# Patient Record
Sex: Female | Born: 2000 | Race: White | Hispanic: No | Marital: Single | Smoking: Never smoker
Health system: Southern US, Community
[De-identification: ages and names within clinical notes are randomized; demographics above are authoritative.]

## PROBLEM LIST (undated history)

## (undated) DIAGNOSIS — F419 Anxiety disorder, unspecified: Secondary | ICD-10-CM

---
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
## 2020-04-01 ENCOUNTER — Other Ambulatory Visit: Payer: Self-pay | Admitting: Certified Nurse Midwife

## 2020-04-01 NOTE — Progress Notes (Signed)
Patient with iron deficiency anemia in third trimester of pregnancy. Labs last drawn on 03/31/2020 at [redacted]w[redacted]d. Hemoglobin was 10.5 and ferritin was 7. Due to this, IV iron repletement is recommended. Will administer 510mg  ferumoxytol weekly x 2 doses. Orders placed.  , CNM 04/01/2020 12:05 PM

## 2020-04-02 ENCOUNTER — Ambulatory Visit: Payer: Medicaid Other | Attending: Certified Nurse Midwife

## 2020-04-09 ENCOUNTER — Ambulatory Visit
Admission: RE | Admit: 2020-04-09 | Discharge: 2020-04-09 | Disposition: A | Discharge status: Home / Self Care | Payer: Medicaid Other | Source: Ambulatory Visit | Attending: Certified Nurse Midwife | Admitting: Certified Nurse Midwife

## 2020-04-09 DIAGNOSIS — D509 Iron deficiency anemia, unspecified: Secondary | ICD-10-CM | POA: Diagnosis not present

## 2020-04-09 DIAGNOSIS — O99013 Anemia complicating pregnancy, third trimester: Secondary | ICD-10-CM | POA: Insufficient documentation

## 2020-04-09 DIAGNOSIS — Z3A36 36 weeks gestation of pregnancy: Secondary | ICD-10-CM | POA: Insufficient documentation

## 2020-04-09 LAB — OB RESULTS CONSOLE GBS: GBS: NEGATIVE

## 2020-04-09 LAB — OB RESULTS CONSOLE HIV ANTIBODY (ROUTINE TESTING): HIV: NONREACTIVE

## 2020-04-09 LAB — OB RESULTS CONSOLE RPR: RPR: NONREACTIVE

## 2020-04-09 MED ORDER — SODIUM CHLORIDE 0.9 % IV SOLN
510.0000 mg | INTRAVENOUS | Status: DC
  Administered 2020-04-09: 510 mg via INTRAVENOUS
  Filled 2020-04-09: qty 17

## 2020-04-16 ENCOUNTER — Ambulatory Visit
Admission: RE | Admit: 2020-04-16 | Discharge: 2020-04-16 | Disposition: A | Discharge status: Home / Self Care | Payer: Medicaid Other | Source: Ambulatory Visit | Attending: Certified Nurse Midwife | Admitting: Certified Nurse Midwife

## 2020-04-16 ENCOUNTER — Other Ambulatory Visit: Payer: Self-pay

## 2020-04-16 ENCOUNTER — Observation Stay
Admission: EM | Admit: 2020-04-16 | Discharge: 2020-04-16 | Disposition: A | Discharge status: Home / Self Care | Payer: Medicaid Other

## 2020-04-16 DIAGNOSIS — Z3A38 38 weeks gestation of pregnancy: Secondary | ICD-10-CM | POA: Insufficient documentation

## 2020-04-16 DIAGNOSIS — O99013 Anemia complicating pregnancy, third trimester: Secondary | ICD-10-CM | POA: Insufficient documentation

## 2020-04-16 DIAGNOSIS — O134 Gestational [pregnancy-induced] hypertension without significant proteinuria, complicating childbirth: Secondary | ICD-10-CM | POA: Diagnosis not present

## 2020-04-16 DIAGNOSIS — Z3403 Encounter for supervision of normal first pregnancy, third trimester: Secondary | ICD-10-CM

## 2020-04-16 DIAGNOSIS — Z20822 Contact with and (suspected) exposure to covid-19: Secondary | ICD-10-CM | POA: Insufficient documentation

## 2020-04-16 DIAGNOSIS — O26893 Other specified pregnancy related conditions, third trimester: Secondary | ICD-10-CM | POA: Diagnosis present

## 2020-04-16 DIAGNOSIS — O163 Unspecified maternal hypertension, third trimester: Secondary | ICD-10-CM | POA: Diagnosis present

## 2020-04-16 DIAGNOSIS — O26843 Uterine size-date discrepancy, third trimester: Secondary | ICD-10-CM | POA: Insufficient documentation

## 2020-04-16 HISTORY — DX: Anxiety disorder, unspecified: F41.9

## 2020-04-16 LAB — COMPREHENSIVE METABOLIC PANEL
ALT: 12 U/L (ref 0–44)
AST: 16 U/L (ref 15–41)
Albumin: 3 g/dL — ABNORMAL LOW (ref 3.5–5.0)
Alkaline Phosphatase: 109 U/L (ref 38–126)
Anion gap: 9 (ref 5–15)
BUN: 6 mg/dL (ref 6–20)
CO2: 24 mmol/L (ref 22–32)
Calcium: 9.1 mg/dL (ref 8.9–10.3)
Chloride: 103 mmol/L (ref 98–111)
Creatinine, Ser: 0.58 mg/dL (ref 0.44–1.00)
GFR, Estimated: >60 mL/min (ref >60)
Glucose, Bld: 86 mg/dL (ref 70–99)
Potassium: 3.6 mmol/L (ref 3.5–5.1)
Sodium: 136 mmol/L (ref 135–145)
Total Bilirubin: 0.5 mg/dL (ref 0.3–1.2)
Total Protein: 6.6 g/dL (ref 6.5–8.1)

## 2020-04-16 LAB — CBC
HCT: 32.2 % — ABNORMAL LOW (ref 36.0–46.0)
Hemoglobin: 10.6 g/dL — ABNORMAL LOW (ref 12.0–15.0)
MCH: 27.7 pg (ref 26.0–34.0)
MCHC: 32.9 g/dL (ref 30.0–36.0)
MCV: 84.1 fL (ref 80.0–100.0)
Platelets: 247 10*3/uL (ref 150–400)
RBC: 3.83 MIL/uL — ABNORMAL LOW (ref 3.87–5.11)
RDW: 14.3 % (ref 11.5–15.5)
WBC: 14.7 10*3/uL — ABNORMAL HIGH (ref 4.0–10.5)
nRBC: 0 % (ref 0.0–0.2)

## 2020-04-16 LAB — RESPIRATORY PANEL BY RT PCR (FLU A&B, COVID)
Influenza A by PCR: NEGATIVE
Influenza B by PCR: NEGATIVE
SARS Coronavirus 2 by RT PCR: NEGATIVE

## 2020-04-16 LAB — PROTEIN / CREATININE RATIO, URINE
Creatinine, Urine: 80 mg/dL
Protein Creatinine Ratio: 0.18 mg/mg{Cre} — ABNORMAL HIGH (ref 0.00–0.15)
Total Protein, Urine: 14 mg/dL

## 2020-04-16 LAB — SAMPLE TO BLOOD BANK

## 2020-04-16 MED ORDER — ACETAMINOPHEN 500 MG PO TABS: 1000.0000 mg | ORAL_TABLET | Freq: Four times a day (QID) | ORAL | Status: DC | PRN

## 2020-04-16 MED ORDER — CALCIUM CARBONATE ANTACID 500 MG PO CHEW: 2.0000 | CHEWABLE_TABLET | ORAL | Status: DC | PRN

## 2020-04-16 MED ORDER — SODIUM CHLORIDE 0.9 % IV SOLN
510.0000 mg | Freq: Once | INTRAVENOUS | Status: DC
  Filled 2020-04-16: qty 17

## 2020-04-16 MED ORDER — PRENATAL MULTIVITAMIN CH: 1.0000 | ORAL_TABLET | Freq: Every day | ORAL | Status: DC

## 2020-04-16 NOTE — OB Triage Note (Signed)
Pt presents to L&D after BP elevated in office for [facility] workup. Pt reports headache that comes and goes since iron infusion last week. Pt reports good fetal movement and denies contractions, leaking fluid or vaginal bleeding. EFM and toco applied and explained as well as POC

## 2020-04-17 ENCOUNTER — Other Ambulatory Visit: Payer: Self-pay

## 2020-04-17 ENCOUNTER — Encounter: Payer: Self-pay | Admitting: Obstetrics & Gynecology

## 2020-04-17 NOTE — Discharge Summary (Signed)
Tara Higgins is a 19 y.o. female. She is at [redacted]w[redacted]d gestation. Patient's last menstrual period was 07/05/2019 (exact date). Not c/w early [place] at [redacted]w[redacted]d Estimated Date of Delivery: 05/01/20  Prenatal care site: [facility]  Chief complaint: elevated b/p in office   Tara Higgins presents today for a preeclampsia evaluation d/t elevated b/p in office today.    S: Resting comfortably. no CTX, no VB.no LOF,  Active fetal movement. Denies HA, changes in vision, or RUQ pain  Pregnancy complicated by: 1. Obesity in pregnancy 2. History of anxiety with recent suicidal ideation  3. Difficult social situation  4. Brothers with congenital heart defect  5. Iron deficiency anemia in pregnancy  6. Uterine S>D   Maternal Medical History:  Past Medical Hx:  has a past medical history of Anxiety and Suicidal ideation (10/2019).    Past Surgical Hx: History reviewed. No pertinent surgical history.    No Known Allergies   Prior to Admission medications   Medication Sig Start Date End Date Taking? Authorizing Provider  Prenatal Multivit-Min-Fe-FA (PRENATAL, W/IRON & FA,) 27-0.8 MG TABS Take 1 tablet by mouth daily.    [provider]    Social History: She  reports that she has never smoked. She has never used smokeless tobacco. She reports previous alcohol use. She reports previous drug use.  Family History: family history includes Anxiety disorder in her mother; Depression in her mother; Drug abuse in her mother; Liver cancer in her maternal grandmother.   Review of Systems: A full review of systems was performed and negative except as noted in the HPI.    O:  BP (!) 143/81   Pulse (!) 113   Temp 98.5 F (36.9 C) (Oral)   Resp 18   Ht 5\' 4"  (1.626 m)   Wt 102.1 kg   LMP 07/05/2019 (Exact Date)   BMI 38.62 kg/m  Results for orders placed or performed during the hospital encounter of 04/16/20 (from the past 48 hour(s))  Protein / creatinine ratio, urine   Collection Time:  04/16/20  5:27 PM  Result Value Ref Range   Creatinine, Urine 80 mg/dL   Total Protein, Urine 14 mg/dL   Protein Creatinine Ratio 0.18 (H) 0.00 - 0.15 mg/mg[Cre]  CBC   Collection Time: 04/16/20  5:42 PM  Result Value Ref Range   WBC 14.7 (H) 4.0 - 10.5 K/uL   RBC 3.83 (L) 3.87 - 5.11 MIL/uL   Hemoglobin 10.6 (L) 12.0 - 15.0 g/dL   HCT 13/03/21 (L) 36 - 46 %   MCV 84.1 80.0 - 100.0 fL   MCH 27.7 26.0 - 34.0 pg   MCHC 32.9 30.0 - 36.0 g/dL   RDW 33.2 95.1 - 88.4 %   Platelets 247 150 - 400 K/uL   nRBC 0.0 0.0 - 0.2 %  Comprehensive metabolic panel   Collection Time: 04/16/20  5:42 PM  Result Value Ref Range   Sodium 136 135 - 145 mmol/L   Potassium 3.6 3.5 - 5.1 mmol/L   Chloride 103 98 - 111 mmol/L   CO2 24 22 - 32 mmol/L   Glucose, Bld 86 70 - 99 mg/dL   BUN 6 6 - 20 mg/dL   Creatinine, Ser 13/03/21 0.44 - 1.00 mg/dL   Calcium 9.1 8.9 - 0.63 mg/dL   Total Protein 6.6 6.5 - 8.1 g/dL   Albumin 3.0 (L) 3.5 - 5.0 g/dL   AST 16 15 - 41 U/L   ALT 12 0 - 44  U/L   Alkaline Phosphatase 109 38 - 126 U/L   Total Bilirubin 0.5 0.3 - 1.2 mg/dL   GFR, Estimated >63 >78 mL/min   Anion gap 9 5 - 15  Sample to Blood Bank   Collection Time: 04/16/20  5:42 PM  Result Value Ref Range   Blood Bank Specimen SAMPLE AVAILABLE FOR TESTING    Sample Expiration      04/19/2020,2359 Performed at [facility], [address] [place], [place] [address]   Respiratory Panel by RT PCR (Flu A&B, Covid) - Nasopharyngeal Swab   Collection Time: 04/16/20  7:33 PM   Specimen: Nasopharyngeal Swab  Result Value Ref Range   SARS Coronavirus 2 by RT PCR NEGATIVE NEGATIVE   Influenza A by PCR NEGATIVE NEGATIVE   Influenza B by PCR NEGATIVE NEGATIVE     Constitutional: NAD, AAOx3  HE/ENT: extraocular movements grossly intact, moist mucous membranes CV: RRR PULM: nl respiratory effort, CTABL     Abd: gravid, non-tender, non-distended, soft      Ext: Non-tender, Nonedmeatous   Psych: mood  appropriate, speech normal Pelvic : deferred  Fetal Monitor: Baseline: 150 bpm, Variability: moderate, Accelerations: present and Decelerations: Absent Toco: Irregular, mild contractions present Category: 1   Assessment: 19 y.o. [redacted]w[redacted]d here for antenatal surveillance during pregnancy.  Principle diagnosis: Gestational hypertension in 3rd trimester   Plan:   Fetal Wellbeing: Reassuring Cat 1 tracing.  Reactive NST   Gestational HTN - several mild range blood pressures are present today in the clinic and in triage   Preeclampsia labs normal  Denies symptoms of headache, changes in vision, or RUQ pain  Discussed induction of labor for gestational HTN.  Risks/benefits of induction of labor versus continued antepartum surveillance were discussed.   Tara Higgins desires to discharge home tonight and is amenable to induction of labor on Friday morning.    Induction of labor for gestational HTN scheduled in L&D for Friday, 04/18/2020 at 0800.    Warning signs reviewed.    D/c home stable, precautions reviewed, follow-up as scheduled on Friday morning.    ----- Margaretmary Eddy, CNM Certified Nurse Midwife [facility]
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
## 2020-04-18 ENCOUNTER — Other Ambulatory Visit: Payer: Self-pay

## 2020-04-18 ENCOUNTER — Encounter: Payer: Self-pay | Admitting: Obstetrics and Gynecology

## 2020-04-18 ENCOUNTER — Inpatient Hospital Stay
Admission: EM | Admit: 2020-04-18 | Discharge: 2020-04-18 | DRG: 833 | Disposition: A | Discharge status: Home / Self Care | Payer: Medicaid Other | Attending: Obstetrics and Gynecology | Admitting: Obstetrics and Gynecology

## 2020-04-18 DIAGNOSIS — O139 Gestational [pregnancy-induced] hypertension without significant proteinuria, unspecified trimester: Secondary | ICD-10-CM | POA: Diagnosis present

## 2020-04-18 DIAGNOSIS — E669 Obesity, unspecified: Secondary | ICD-10-CM | POA: Diagnosis present

## 2020-04-18 DIAGNOSIS — D649 Anemia, unspecified: Secondary | ICD-10-CM | POA: Diagnosis present

## 2020-04-18 DIAGNOSIS — O99013 Anemia complicating pregnancy, third trimester: Secondary | ICD-10-CM | POA: Diagnosis present

## 2020-04-18 DIAGNOSIS — O133 Gestational [pregnancy-induced] hypertension without significant proteinuria, third trimester: Secondary | ICD-10-CM | POA: Diagnosis present

## 2020-04-18 DIAGNOSIS — Z3A38 38 weeks gestation of pregnancy: Secondary | ICD-10-CM

## 2020-04-18 DIAGNOSIS — Z5329 Procedure and treatment not carried out because of patient's decision for other reasons: Secondary | ICD-10-CM | POA: Diagnosis present

## 2020-04-18 DIAGNOSIS — O99213 Obesity complicating pregnancy, third trimester: Secondary | ICD-10-CM | POA: Diagnosis present

## 2020-04-18 DIAGNOSIS — Z20822 Contact with and (suspected) exposure to covid-19: Secondary | ICD-10-CM | POA: Diagnosis present

## 2020-04-18 MED ORDER — MISOPROSTOL 25 MCG QUARTER TABLET: 25.0000 ug | ORAL_TABLET | ORAL | Status: DC | PRN

## 2020-04-18 MED ORDER — LACTATED RINGERS IV SOLN: 500.0000 mL | INTRAVENOUS | Status: DC | PRN

## 2020-04-18 MED ORDER — BUTORPHANOL TARTRATE 1 MG/ML IJ SOLN: 1.0000 mg | INTRAMUSCULAR | Status: DC | PRN

## 2020-04-18 MED ORDER — ACETAMINOPHEN 500 MG PO TABS: 1000.0000 mg | ORAL_TABLET | Freq: Four times a day (QID) | ORAL | Status: DC | PRN

## 2020-04-18 MED ORDER — LACTATED RINGERS IV SOLN: INTRAVENOUS | Status: DC

## 2020-04-18 MED ORDER — SODIUM CHLORIDE 0.9 % IV SOLN: 250.0000 mL | INTRAVENOUS | Status: DC | PRN

## 2020-04-18 MED ORDER — LIDOCAINE HCL (PF) 1 % IJ SOLN: 30.0000 mL | INTRAMUSCULAR | Status: DC | PRN

## 2020-04-18 MED ORDER — ONDANSETRON HCL 4 MG/2ML IJ SOLN: 4.0000 mg | Freq: Four times a day (QID) | INTRAMUSCULAR | Status: DC | PRN

## 2020-04-18 MED ORDER — CALCIUM CARBONATE ANTACID 500 MG PO CHEW: 400.0000 mg | CHEWABLE_TABLET | Freq: Three times a day (TID) | ORAL | Status: DC | PRN

## 2020-04-18 NOTE — Progress Notes (Signed)
Called to room by nursing, Pt refusing interventions for admission for planned IOL due to [facility].   Today's Vitals   04/18/20 0940  BP: (!) 147/92  Pulse: (!) 107  Resp: 18  Temp: 98.4 F (36.9 C)  TempSrc: Oral  Weight: 102.1 kg  Height: 5\' 4"  (1.626 m)   Body mass index is 38.62 kg/m.  Physical Exam Constitutional:      Appearance: Normal appearance.  HENT:     Head: Normocephalic and atraumatic.  Eyes:     General: No scleral icterus.    Extraocular Movements: Extraocular movements intact.  Cardiovascular:     Rate and Rhythm: Regular rhythm.  Pulmonary:     Effort: Pulmonary effort is normal. No respiratory distress.  Abdominal:     Comments: Gravid nontender fundus.   Musculoskeletal:     Right lower leg: Edema present.     Left lower leg: Edema present.     Comments: Bilateral edema of hands/forearms noted.    Neurological:     Mental Status: She is alert.     Discussed with pt regarding risks of leaving without IOL due to [facility]. Reviewed risks of seizures, abruption, fetal distress or fetal death. Pt and family member on phone (mother-in-law) state that pt is scared and prefers to be taken care of in [place]. Reviewed prenatal care at [facility] clinic with planned delivery at [facility]. Pt notified that she may leave against medical advice, and that her visit may not be covered by her insurance.   [facility], CNM 04/18/2020 10:30 AM

## 2020-04-18 NOTE — Progress Notes (Signed)
  Tara Higgins is a 19 y.o. female patient that presents to L&D for scheduled induction of labor for gestational hypertension. Pt is alert - oriented  X 4 - no acute distress noted.  Pt is a G1P0 at [redacted]w[redacted]d. Pt denies signs and symptons consistent with rupture of membranes or active vaginal bleeding. Pt denies contractions and states positive fetal movement.  Pt placed on continuous EFM and TOCO applied to non-tender abdomen and assessing. Initial FHT 150.  Elevated blood pressure, initial BP 147/92, Temp 98.4. Pt has generalized swelling +2.  Orders released for induction. R.McVey, CNM aware of pt.
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
## 2020-04-18 NOTE — Progress Notes (Signed)
PT refused PIV start, provider and RN at bedside.

## 2020-04-18 NOTE — Progress Notes (Signed)
Pt refused IV. There were 2 attempts at IV placement by RN, both IVs became infiltrated. Pt refused further IV placement and wants to leave the facility. R.McVey, CNM aware and spoke with pt. Pt spoke with significant other who is at bedside and to her family member over the phone and decided to stay and proceed with her induction. Pt agreed to having an IV placed and blood work drawn by IV team. IV team consult order placed, admission questions completed by this RN. Marcy Siren, RN from IV team came to bedside at 1055 for IV placement. Pt refused IV by IV team and stated she wanted to go home or [facility]. Heloise Ochoa, CNM at bedside and discussed with patient the importance of staying and proceeding with the induction. Pt reused. Pt signed against medical advice form, witnessed by Patsy Lager dated 04/18/2020 @1100 .  Pt ambulatory and escorted out of the hospital by security at 1105.

## 2020-12-06 ENCOUNTER — Other Ambulatory Visit: Payer: Self-pay

## 2020-12-06 ENCOUNTER — Encounter: Payer: Self-pay | Admitting: Emergency Medicine

## 2020-12-06 DIAGNOSIS — Z5321 Procedure and treatment not carried out due to patient leaving prior to being seen by health care provider: Secondary | ICD-10-CM | POA: Insufficient documentation

## 2020-12-06 DIAGNOSIS — M545 Low back pain, unspecified: Secondary | ICD-10-CM | POA: Insufficient documentation

## 2020-12-06 NOTE — ED Triage Notes (Signed)
Pt reports that she developed lower back pain this am, it is now gong into her hips. It only hurts when she sits or bends over. She thinks she pulled a muscle. Denies any blood in urine or pain during urination

## 2020-12-07 ENCOUNTER — Emergency Department
Admission: EM | Admit: 2020-12-07 | Discharge: 2020-12-07 | Disposition: A | Discharge status: Home / Self Care | Payer: Medicaid Other | Attending: Emergency Medicine | Admitting: Emergency Medicine

## 2020-12-07 NOTE — ED Notes (Signed)
No answer when called 

## 2020-12-07 NOTE — ED Notes (Signed)
No answer when called in lobby, ed tech shan,  looked outside for pt, pt not outside.

## 2020-12-07 NOTE — ED Notes (Signed)
No answer when called x 3.  

## 2021-06-14 NOTE — L&D Delivery Note (Signed)
Delivery Note  First Stage: Labor onset: 0900 Augmentation : cytotec x2 Analgesia /Anesthesia intrapartum: IV fentanyl SROM at 0944  Second Stage: Complete dilation at 0944 Onset of pushing at 0944 FHR second stage: not tracing d/t rapidness of imminent delivery, Cat I tracing immediately prior to delivery  Delivery of a viable female infant 02/13/2022 at 0946 by Donato Schultz, CNM. delivery of fetal head in OA position with restitution to LOA then back to OA. No nuchal cord;  Anterior then posterior shoulders delivered easily with gentle downward traction. Baby placed on mom's chest, and attended to by peds.  Cord double clamped after cessation of pulsation, cut by FOB Cord blood sample collected   Third Stage: Placenta delivered Tomasa Blase intact with 3 VC @ [phone] Placenta disposition: discarded Uterine tone firm / bleeding scant  No laceration identified  Anesthesia for repair: none needed Repair none Est. Blood Loss (mL):  Complications: none, precipitous delivery  Mom to postpartum.  Baby to Couplet care / Skin to Skin.  Newborn: Birth Weight: TBD, infant skin-to-skin  Apgar Scores: 8, 9 Feeding planned: breastmilk and formula

## 2021-08-07 DIAGNOSIS — O0993 Supervision of high risk pregnancy, unspecified, third trimester: Secondary | ICD-10-CM | POA: Insufficient documentation

## 2021-08-30 DIAGNOSIS — O99211 Obesity complicating pregnancy, first trimester: Secondary | ICD-10-CM | POA: Insufficient documentation

## 2021-09-29 LAB — OB RESULTS CONSOLE GC/CHLAMYDIA
Chlamydia: NEGATIVE
Neisseria Gonorrhea: NEGATIVE

## 2021-09-29 LAB — OB RESULTS CONSOLE HIV ANTIBODY (ROUTINE TESTING): HIV: NONREACTIVE

## 2021-09-29 LAB — OB RESULTS CONSOLE HEPATITIS B SURFACE ANTIGEN: Hepatitis B Surface Ag: NEGATIVE

## 2021-09-29 LAB — OB RESULTS CONSOLE VARICELLA ZOSTER ANTIBODY, IGG: Varicella: IMMUNE

## 2021-09-29 LAB — OB RESULTS CONSOLE GBS: GBS: POSITIVE

## 2021-09-29 LAB — OB RESULTS CONSOLE RUBELLA ANTIBODY, IGM: Rubella: IMMUNE

## 2021-09-29 LAB — OB RESULTS CONSOLE RPR: RPR: NONREACTIVE

## 2021-10-06 DIAGNOSIS — R8271 Bacteriuria: Secondary | ICD-10-CM | POA: Insufficient documentation

## 2021-12-14 DIAGNOSIS — Z23 Encounter for immunization: Secondary | ICD-10-CM | POA: Diagnosis not present

## 2021-12-24 DIAGNOSIS — Z3483 Encounter for supervision of other normal pregnancy, third trimester: Secondary | ICD-10-CM | POA: Diagnosis not present

## 2021-12-29 DIAGNOSIS — O26843 Uterine size-date discrepancy, third trimester: Secondary | ICD-10-CM | POA: Diagnosis not present

## 2022-01-11 DIAGNOSIS — O0993 Supervision of high risk pregnancy, unspecified, third trimester: Secondary | ICD-10-CM | POA: Diagnosis not present

## 2022-01-11 DIAGNOSIS — O163 Unspecified maternal hypertension, third trimester: Secondary | ICD-10-CM | POA: Diagnosis not present

## 2022-01-25 DIAGNOSIS — R7302 Impaired glucose tolerance (oral): Secondary | ICD-10-CM | POA: Diagnosis not present

## 2022-01-25 DIAGNOSIS — O133 Gestational [pregnancy-induced] hypertension without significant proteinuria, third trimester: Secondary | ICD-10-CM | POA: Diagnosis not present

## 2022-01-26 ENCOUNTER — Encounter: Payer: Self-pay | Admitting: Obstetrics and Gynecology

## 2022-01-26 ENCOUNTER — Other Ambulatory Visit: Payer: Self-pay

## 2022-01-26 ENCOUNTER — Observation Stay
Admission: EM | Admit: 2022-01-26 | Discharge: 2022-01-26 | Disposition: A | Discharge status: Home / Self Care | Payer: Medicaid Other | Attending: Obstetrics and Gynecology | Admitting: Obstetrics and Gynecology

## 2022-01-26 DIAGNOSIS — O99213 Obesity complicating pregnancy, third trimester: Secondary | ICD-10-CM | POA: Insufficient documentation

## 2022-01-26 DIAGNOSIS — Z3A34 34 weeks gestation of pregnancy: Secondary | ICD-10-CM | POA: Diagnosis not present

## 2022-01-26 DIAGNOSIS — E669 Obesity, unspecified: Secondary | ICD-10-CM | POA: Insufficient documentation

## 2022-01-26 DIAGNOSIS — O133 Gestational [pregnancy-induced] hypertension without significant proteinuria, third trimester: Secondary | ICD-10-CM | POA: Diagnosis not present

## 2022-01-26 DIAGNOSIS — Z79899 Other long term (current) drug therapy: Secondary | ICD-10-CM | POA: Insufficient documentation

## 2022-01-26 LAB — COMPREHENSIVE METABOLIC PANEL
ALT: 10 U/L (ref 0–44)
AST: 18 U/L (ref 15–41)
Albumin: 2.9 g/dL — ABNORMAL LOW (ref 3.5–5.0)
Alkaline Phosphatase: 101 U/L (ref 38–126)
Anion gap: 7 (ref 5–15)
BUN: 7 mg/dL (ref 6–20)
CO2: 22 mmol/L (ref 22–32)
Calcium: 9.6 mg/dL (ref 8.9–10.3)
Chloride: 105 mmol/L (ref 98–111)
Creatinine, Ser: 0.57 mg/dL (ref 0.44–1.00)
GFR, Estimated: >60 mL/min (ref >60)
Glucose, Bld: 90 mg/dL (ref 70–99)
Potassium: 4 mmol/L (ref 3.5–5.1)
Sodium: 134 mmol/L — ABNORMAL LOW (ref 135–145)
Total Bilirubin: 0.3 mg/dL (ref 0.3–1.2)
Total Protein: 6.7 g/dL (ref 6.5–8.1)

## 2022-01-26 LAB — PROTEIN / CREATININE RATIO, URINE
Creatinine, Urine: 53 mg/dL
Protein Creatinine Ratio: 0.17 mg/mg{Cre} — ABNORMAL HIGH (ref 0.00–0.15)
Total Protein, Urine: 9 mg/dL

## 2022-01-26 LAB — CBC
HCT: 34.7 % — ABNORMAL LOW (ref 36.0–46.0)
Hemoglobin: 11.8 g/dL — ABNORMAL LOW (ref 12.0–15.0)
MCH: 28.4 pg (ref 26.0–34.0)
MCHC: 34 g/dL (ref 30.0–36.0)
MCV: 83.6 fL (ref 80.0–100.0)
Platelets: 184 10*3/uL (ref 150–400)
RBC: 4.15 MIL/uL (ref 3.87–5.11)
RDW: 12.4 % (ref 11.5–15.5)
WBC: 10.1 10*3/uL (ref 4.0–10.5)
nRBC: 0 % (ref 0.0–0.2)

## 2022-01-26 NOTE — OB Triage Note (Signed)
Pt is a 20y/o G2P1 at [redacted]w[redacted]d with c/o elevated BP's. Pt states +FM. Pt denies LOF, CTX and VB. Pt denies visual changes, RUQ pain, and abnormal swelling. Pt states she has had a dull headache unrelieved by tylenol. Pt denies pain. Monitors applied and assessing. Initial FHT 140.

## 2022-01-26 NOTE — Discharge Summary (Signed)
Tara Higgins is a 21 y.o. female. She is at [redacted]w[redacted]d gestation. No LMP recorded. Patient is pregnant. Estimated Date of Delivery: 03/03/22  Prenatal care site: [facility]   Current pregnancy complicated by:  Uterine S>D @ 28wks 12/29/21 EFW: 3lb12oz (1708g) = 60% Elevated 1hr GTT 153 As of 34.5wks, has not completed 3hr GTT A1C added onto labs 01/25/22 Gestational HTN Low dose ASA at 12-16 weeks  Labs per obesity plan  01/11/22 152/94 rechecked 136/88 sent down for labs 01/12/22: 135/90- dx GHTN, labs repeat today. Growth Korea ordered.  Obesity in pregnancy  Labs: Early 1hr GTT: 108 HgA1C: 5.4 CMP: WNL Urine PCR: 286 TSH: 2.479 GBS positive Needs IP prophylaxis  Chief complaint:elevated BP at home and dull headache unrelieved by tylenol.    S: Resting comfortably. no CTX, no VB.no LOF,  Active fetal movement. Denies: HA, visual changes, SOB, or RUQ/epigastric pain  Maternal Medical History:   Past Medical History:  Diagnosis Date   Anxiety    Suicidal ideation 10/2019    History reviewed. No pertinent surgical history.  No Known Allergies  Prior to Admission medications   Medication Sig Start Date End Date Taking? Authorizing Provider  Prenatal Multivit-Min-Fe-FA (PRENATAL, W/IRON & FA,) 27-0.8 MG TABS Take 1 tablet by mouth daily.   Yes [provider]      Social History: She  reports that she has never smoked. She has never used smokeless tobacco. She reports that she does not currently use alcohol. She reports that she does not currently use drugs.  Family History: family history includes Anxiety disorder in her mother; Depression in her mother; Drug abuse in her mother; Liver cancer in her maternal grandmother.   Review of Systems: A full review of systems was performed and negative except as noted in the HPI.     O:  BP 125/77   Pulse 68   Temp 98.2 F (36.8 C) (Oral)   Resp 19   Vitals:   01/26/22 0056 01/26/22 0141 01/26/22 0156  01/26/22 0211  BP: (!) 143/100 130/85 123/80 125/77    Results for orders placed or performed during the hospital encounter of 01/26/22 (from the past 48 hour(s))  CBC   Collection Time: 01/26/22  1:19 AM  Result Value Ref Range   WBC 10.1 4.0 - 10.5 K/uL   RBC 4.15 3.87 - 5.11 MIL/uL   Hemoglobin 11.8 (L) 12.0 - 15.0 g/dL   HCT 16.1 (L) 09.6 - 04.5 %   MCV 83.6 80.0 - 100.0 fL   MCH 28.4 26.0 - 34.0 pg   MCHC 34.0 30.0 - 36.0 g/dL   RDW 40.9 81.1 - 91.4 %   Platelets 184 150 - 400 K/uL   nRBC 0.0 0.0 - 0.2 %  Protein / creatinine ratio, urine   Collection Time: 01/26/22  1:19 AM  Result Value Ref Range   Creatinine, Urine 53 mg/dL   Total Protein, Urine 9 mg/dL   Protein Creatinine Ratio 0.17 (H) 0.00 - 0.15 mg/mg[Cre]  Comprehensive metabolic panel   Collection Time: 01/26/22  1:19 AM  Result Value Ref Range   Sodium 134 (L) 135 - 145 mmol/L   Potassium 4.0 3.5 - 5.1 mmol/L   Chloride 105 98 - 111 mmol/L   CO2 22 22 - 32 mmol/L   Glucose, Bld 90 70 - 99 mg/dL   BUN 7 6 - 20 mg/dL   Creatinine, Ser 7.82 0.44 - 1.00 mg/dL   Calcium 9.6 8.9 - 10.3  mg/dL   Total Protein 6.7 6.5 - 8.1 g/dL   Albumin 2.9 (L) 3.5 - 5.0 g/dL   AST 18 15 - 41 U/L   ALT 10 0 - 44 U/L   Alkaline Phosphatase 101 38 - 126 U/L   Total Bilirubin 0.3 0.3 - 1.2 mg/dL   GFR, Estimated >92 >92 mL/min   Anion gap 7 5 - 15     Constitutional: NAD, AAOx3  HE/ENT: extraocular movements grossly intact, moist mucous membranes CV: RRR PULM: nl respiratory effort, CTABL     Abd: gravid, non-tender, non-distended, soft      Ext: Non-tender, Nonedematous   Psych: mood appropriate, speech normal Pelvic: deferred  Fetal  monitoring: Cat I Appropriate for GA Baseline: 140bpm Variability: moderate Accelerations: present x >2 Decelerations absent Time    A/P: 21 y.o. [redacted]w[redacted]d here for antenatal surveillance   Principle Diagnosis:  GHTN  Labor: not present.  Fetal Wellbeing: Reassuring Cat 1  tracing and Reactive NST  D/c home stable, precautions reviewed, follow-up as scheduled.    Randa Ngo, CNM 01/26/2022  6:39 AM

## 2022-01-26 NOTE — Progress Notes (Signed)
Pt to be d/c home and to self care. Pt given teaching on when to seek medical attention (LOF, VB and decreased FM, etc..). Pt verbalized understanding of d/c instructions.   ?

## 2022-02-09 DIAGNOSIS — O0993 Supervision of high risk pregnancy, unspecified, third trimester: Secondary | ICD-10-CM | POA: Diagnosis not present

## 2022-02-09 DIAGNOSIS — O133 Gestational [pregnancy-induced] hypertension without significant proteinuria, third trimester: Secondary | ICD-10-CM | POA: Diagnosis not present

## 2022-02-11 ENCOUNTER — Other Ambulatory Visit: Payer: Self-pay

## 2022-02-11 ENCOUNTER — Encounter: Payer: Self-pay | Admitting: Obstetrics and Gynecology

## 2022-02-11 ENCOUNTER — Observation Stay
Admission: EM | Admit: 2022-02-11 | Discharge: 2022-02-12 | Disposition: A | Discharge status: Home / Self Care | Payer: Medicaid Other | Source: Home / Self Care | Admitting: Obstetrics and Gynecology

## 2022-02-11 DIAGNOSIS — Z3A37 37 weeks gestation of pregnancy: Secondary | ICD-10-CM | POA: Insufficient documentation

## 2022-02-11 DIAGNOSIS — O1493 Unspecified pre-eclampsia, third trimester: Secondary | ICD-10-CM | POA: Insufficient documentation

## 2022-02-11 DIAGNOSIS — O163 Unspecified maternal hypertension, third trimester: Secondary | ICD-10-CM | POA: Diagnosis present

## 2022-02-11 NOTE — OB Triage Note (Signed)
Pt is a 20y/o G2P1 at [redacted]w[redacted]d with c/o elevated blood pressures today in the 150's/100's. Pt states +FM. Pt denies LOF, CTX and VB. Monitors applied and assessing. Initial FHT 145.

## 2022-02-12 ENCOUNTER — Other Ambulatory Visit: Payer: Self-pay

## 2022-02-12 ENCOUNTER — Inpatient Hospital Stay
Admission: EM | Admit: 2022-02-12 | Discharge: 2022-02-14 | DRG: 807 | Disposition: A | Discharge status: Home / Self Care | Payer: Medicaid Other

## 2022-02-12 ENCOUNTER — Encounter: Payer: Self-pay | Admitting: Obstetrics and Gynecology

## 2022-02-12 DIAGNOSIS — D509 Iron deficiency anemia, unspecified: Secondary | ICD-10-CM | POA: Diagnosis present

## 2022-02-12 DIAGNOSIS — O99013 Anemia complicating pregnancy, third trimester: Secondary | ICD-10-CM

## 2022-02-12 DIAGNOSIS — O1403 Mild to moderate pre-eclampsia, third trimester: Secondary | ICD-10-CM | POA: Diagnosis not present

## 2022-02-12 DIAGNOSIS — O9902 Anemia complicating childbirth: Secondary | ICD-10-CM | POA: Diagnosis present

## 2022-02-12 DIAGNOSIS — R8271 Bacteriuria: Secondary | ICD-10-CM

## 2022-02-12 DIAGNOSIS — O1404 Mild to moderate pre-eclampsia, complicating childbirth: Secondary | ICD-10-CM | POA: Diagnosis not present

## 2022-02-12 DIAGNOSIS — O163 Unspecified maternal hypertension, third trimester: Secondary | ICD-10-CM | POA: Diagnosis present

## 2022-02-12 DIAGNOSIS — Z3A37 37 weeks gestation of pregnancy: Secondary | ICD-10-CM

## 2022-02-12 DIAGNOSIS — O99214 Obesity complicating childbirth: Secondary | ICD-10-CM | POA: Diagnosis present

## 2022-02-12 DIAGNOSIS — O99211 Obesity complicating pregnancy, first trimester: Secondary | ICD-10-CM

## 2022-02-12 DIAGNOSIS — O1493 Unspecified pre-eclampsia, third trimester: Secondary | ICD-10-CM

## 2022-02-12 DIAGNOSIS — O99824 Streptococcus B carrier state complicating childbirth: Secondary | ICD-10-CM | POA: Diagnosis present

## 2022-02-12 DIAGNOSIS — O0993 Supervision of high risk pregnancy, unspecified, third trimester: Secondary | ICD-10-CM

## 2022-02-12 DIAGNOSIS — O1490 Unspecified pre-eclampsia, unspecified trimester: Secondary | ICD-10-CM | POA: Diagnosis present

## 2022-02-12 LAB — COMPREHENSIVE METABOLIC PANEL
ALT: 9 U/L (ref 0–44)
ALT: 9 U/L (ref 0–44)
AST: 17 U/L (ref 15–41)
AST: 26 U/L (ref 15–41)
Albumin: 2.7 g/dL — ABNORMAL LOW (ref 3.5–5.0)
Albumin: 2.9 g/dL — ABNORMAL LOW (ref 3.5–5.0)
Alkaline Phosphatase: 115 U/L (ref 38–126)
Alkaline Phosphatase: 116 U/L (ref 38–126)
Anion gap: 7 (ref 5–15)
Anion gap: 8 (ref 5–15)
BUN: 11 mg/dL (ref 6–20)
BUN: 12 mg/dL (ref 6–20)
CO2: 25 mmol/L (ref 22–32)
CO2: 21 mmol/L — ABNORMAL LOW (ref 22–32)
Calcium: 9.4 mg/dL (ref 8.9–10.3)
Calcium: 8.9 mg/dL (ref 8.9–10.3)
Chloride: 107 mmol/L (ref 98–111)
Chloride: 107 mmol/L (ref 98–111)
Creatinine, Ser: 0.68 mg/dL (ref 0.44–1.00)
Creatinine, Ser: 0.69 mg/dL (ref 0.44–1.00)
GFR, Estimated: >60 mL/min (ref >60)
GFR, Estimated: >60 mL/min (ref >60)
Glucose, Bld: 84 mg/dL (ref 70–99)
Glucose, Bld: 125 mg/dL — ABNORMAL HIGH (ref 70–99)
Potassium: 4.3 mmol/L (ref 3.5–5.1)
Potassium: 3.6 mmol/L (ref 3.5–5.1)
Sodium: 139 mmol/L (ref 135–145)
Sodium: 136 mmol/L (ref 135–145)
Total Bilirubin: 0.3 mg/dL (ref 0.3–1.2)
Total Bilirubin: 0.6 mg/dL (ref 0.3–1.2)
Total Protein: 6.4 g/dL — ABNORMAL LOW (ref 6.5–8.1)
Total Protein: 7 g/dL (ref 6.5–8.1)

## 2022-02-12 LAB — PROTEIN / CREATININE RATIO, URINE
Creatinine, Urine: 209 mg/dL
Protein Creatinine Ratio: 1.06 mg/mg{Cre} — ABNORMAL HIGH (ref 0.00–0.15)
Total Protein, Urine: 222 mg/dL

## 2022-02-12 LAB — CBC WITH DIFFERENTIAL/PLATELET
Abs Immature Granulocytes: 0.07 10*3/uL (ref 0.00–0.07)
Basophils Absolute: 0 10*3/uL (ref 0.0–0.1)
Basophils Relative: 0 %
Eosinophils Absolute: 0.1 10*3/uL (ref 0.0–0.5)
Eosinophils Relative: 0 %
HCT: 36.2 % (ref 36.0–46.0)
Hemoglobin: 12.2 g/dL (ref 12.0–15.0)
Immature Granulocytes: 1 %
Lymphocytes Relative: 15 %
Lymphs Abs: 2.1 10*3/uL (ref 0.7–4.0)
MCH: 28.7 pg (ref 26.0–34.0)
MCHC: 33.7 g/dL (ref 30.0–36.0)
MCV: 85.2 fL (ref 80.0–100.0)
Monocytes Absolute: 1.2 10*3/uL — ABNORMAL HIGH (ref 0.1–1.0)
Monocytes Relative: 9 %
Neutro Abs: 10.2 10*3/uL — ABNORMAL HIGH (ref 1.7–7.7)
Neutrophils Relative %: 75 %
Platelets: 194 10*3/uL (ref 150–400)
RBC: 4.25 MIL/uL (ref 3.87–5.11)
RDW: 12.7 % (ref 11.5–15.5)
WBC: 13.6 10*3/uL — ABNORMAL HIGH (ref 4.0–10.5)
nRBC: 0 % (ref 0.0–0.2)

## 2022-02-12 LAB — CBC
HCT: 35.6 % — ABNORMAL LOW (ref 36.0–46.0)
Hemoglobin: 11.9 g/dL — ABNORMAL LOW (ref 12.0–15.0)
MCH: 28.2 pg (ref 26.0–34.0)
MCHC: 33.4 g/dL (ref 30.0–36.0)
MCV: 84.4 fL (ref 80.0–100.0)
Platelets: 203 10*3/uL (ref 150–400)
RBC: 4.22 MIL/uL (ref 3.87–5.11)
RDW: 12.7 % (ref 11.5–15.5)
WBC: 9.8 10*3/uL (ref 4.0–10.5)
nRBC: 0 % (ref 0.0–0.2)

## 2022-02-12 LAB — TYPE AND SCREEN
ABO/RH(D): O POS
Antibody Screen: NEGATIVE

## 2022-02-12 MED ORDER — SODIUM CHLORIDE 0.9% FLUSH: 3.0000 mL | INTRAVENOUS | Status: DC | PRN

## 2022-02-12 MED ORDER — MISOPROSTOL 25 MCG QUARTER TABLET
25.0000 ug | ORAL_TABLET | Freq: Once | ORAL | Status: AC
  Administered 2022-02-12: 25 ug via ORAL

## 2022-02-12 MED ORDER — TERBUTALINE SULFATE 1 MG/ML IJ SOLN: 0.2500 mg | Freq: Once | INTRAMUSCULAR | Status: DC | PRN

## 2022-02-12 MED ORDER — ACETAMINOPHEN 500 MG PO TABS: 1000.0000 mg | ORAL_TABLET | Freq: Four times a day (QID) | ORAL | Status: DC | PRN

## 2022-02-12 MED ORDER — OXYTOCIN BOLUS FROM INFUSION
333.0000 mL | Freq: Once | INTRAVENOUS | Status: AC
  Administered 2022-02-13: 333 mL via INTRAVENOUS

## 2022-02-12 MED ORDER — MISOPROSTOL 25 MCG QUARTER TABLET: 25.0000 ug | ORAL_TABLET | Freq: Once | ORAL | Status: AC

## 2022-02-12 MED ORDER — LIDOCAINE HCL (PF) 1 % IJ SOLN: 30.0000 mL | INTRAMUSCULAR | Status: DC | PRN

## 2022-02-12 MED ORDER — MISOPROSTOL 25 MCG QUARTER TABLET: 25.0000 ug | ORAL_TABLET | Freq: Once | ORAL | Status: DC

## 2022-02-12 MED ORDER — SODIUM CHLORIDE 0.9 % IV SOLN: 250.0000 mL | INTRAVENOUS | Status: DC | PRN

## 2022-02-12 MED ORDER — OXYTOCIN-SODIUM CHLORIDE 30-0.9 UT/500ML-% IV SOLN: 1.0000 m[IU]/min | INTRAVENOUS | Status: DC

## 2022-02-12 MED ORDER — FENTANYL CITRATE (PF) 100 MCG/2ML IJ SOLN
50.0000 ug | INTRAMUSCULAR | Status: DC | PRN
  Administered 2022-02-13: 50 ug via INTRAVENOUS
  Filled 2022-02-12: qty 2

## 2022-02-12 MED ORDER — PENICILLIN G POT IN DEXTROSE 60000 UNIT/ML IV SOLN: 3.0000 10*6.[IU] | INTRAVENOUS | Status: DC

## 2022-02-12 MED ORDER — SODIUM CHLORIDE 0.9 % IV SOLN: 5.0000 10*6.[IU] | Freq: Once | INTRAVENOUS | Status: DC

## 2022-02-12 MED ORDER — ONDANSETRON HCL 4 MG/2ML IJ SOLN: 4.0000 mg | Freq: Four times a day (QID) | INTRAMUSCULAR | Status: DC | PRN

## 2022-02-12 MED ORDER — LACTATED RINGERS IV SOLN: INTRAVENOUS | Status: DC

## 2022-02-12 MED ORDER — SODIUM CHLORIDE 0.9% FLUSH: 3.0000 mL | Freq: Two times a day (BID) | INTRAVENOUS | Status: DC

## 2022-02-12 MED ORDER — SOD CITRATE-CITRIC ACID 500-334 MG/5ML PO SOLN: 30.0000 mL | ORAL | Status: DC | PRN

## 2022-02-12 MED ORDER — MISOPROSTOL 25 MCG QUARTER TABLET
25.0000 ug | ORAL_TABLET | Freq: Once | ORAL | Status: AC
  Administered 2022-02-13: 25 ug via ORAL
  Filled 2022-02-12: qty 1

## 2022-02-12 MED ORDER — MISOPROSTOL 25 MCG QUARTER TABLET
ORAL_TABLET | ORAL | Status: AC
  Administered 2022-02-12: 25 ug via VAGINAL
  Filled 2022-02-12: qty 2

## 2022-02-12 MED ORDER — LACTATED RINGERS IV SOLN: 500.0000 mL | INTRAVENOUS | Status: DC | PRN

## 2022-02-12 MED ORDER — OXYTOCIN-SODIUM CHLORIDE 30-0.9 UT/500ML-% IV SOLN
2.5000 [IU]/h | INTRAVENOUS | Status: DC
  Administered 2022-02-13: 2.5 [IU]/h via INTRAVENOUS
  Filled 2022-02-12 (×2): qty 500

## 2022-02-12 MED ORDER — MISOPROSTOL 25 MCG QUARTER TABLET
25.0000 ug | ORAL_TABLET | Freq: Once | ORAL | Status: DC
Start: 2022-02-13 — End: 2022-02-12

## 2022-02-12 MED ORDER — MISOPROSTOL 25 MCG QUARTER TABLET
25.0000 ug | ORAL_TABLET | Freq: Once | ORAL | Status: AC
  Administered 2022-02-13: 25 ug via VAGINAL
  Filled 2022-02-12: qty 1

## 2022-02-12 NOTE — Progress Notes (Signed)
Pt discharged in stable condition and will return at 2pm today for IOL after she arranges childcare.

## 2022-02-12 NOTE — Discharge Summary (Signed)
Tara Higgins is a 21 y.o. female. She is at [redacted]w[redacted]d gestation. No LMP recorded. Patient is pregnant. Estimated Date of Delivery: 03/03/22  Prenatal care site: [facility]  Chief complaint: elevated blood pressure at home   HPI: Tara Higgins presents to L&D with complaints of elevated blood pressures at home   Factors complicating pregnancy: Obesity in pregnancy  Uterine S>D  Gestational hypertension  GBS positive  Elevated 1hr GTT - did not complete 3hr GCT   S: Resting comfortably. no CTX, no VB.no LOF,  Active fetal movement. Denies HA, changes in vision, or RUQ pain   Maternal Medical History:  Past Medical Hx:  has a past medical history of Anxiety and Suicidal ideation (10/2019).    Past Surgical Hx:  has no past surgical history on file.   No Known Allergies   Prior to Admission medications   Medication Sig Start Date End Date Taking? Authorizing Provider  Prenatal Multivit-Min-Fe-FA (PRENATAL, W/IRON & FA,) 27-0.8 MG TABS Take 1 tablet by mouth daily.   Yes [provider]    Social History: She  reports that she has never smoked. She has never used smokeless tobacco. She reports that she does not currently use alcohol. She reports that she does not currently use drugs.  Family History: family history includes Anxiety disorder in her mother; Depression in her mother; Drug abuse in her mother; Liver cancer in her maternal grandmother. \  Review of Systems: A full review of systems was performed and negative except as noted in the HPI.     Pertinent Results:  Prenatal Labs: Blood type/Rh O pos  Antibody screen Negative    Rubella Immune    Varicella Immune  RPR NR  HBsAg NR  Hep C NR   HIV NR  GC neg  Chlamydia neg  Genetic screening cfDNA negative   1 hour GTT 153  3 hour GTT Did not complete   GBS Positive     O:  BP 112/77   Pulse 86   Temp 97.8 F (36.6 C) (Oral)   Resp 18   Ht 5\' 4"  (1.626 m)   Wt 110.2 kg   BMI 41.71 kg/m    02/12/22 0700 113/71  02/12/22 0632 117/78  02/12/22 0602 113/80  02/12/22 0532 123/86  02/12/22 0502 127/88  02/12/22 0432 130/72  02/12/22 0402 128/70  02/12/22 0332 109/71  02/12/22 0302 129/78  02/12/22 0247 129/81  02/12/22 0232 133/82  02/12/22 0217 138/89  02/12/22 0202 126/87  02/12/22 0147 142/89 Abnormal   02/12/22 0132 140/81 Abnormal   02/12/22 0117 140/83 Abnormal   02/12/22 0102 130/89  02/12/22 0047 126/82  02/12/22 0030 124/74  02/12/22 0017 127/71  02/12/22 0001 132/95 Abnormal   02/11/22 2346 137/91 Abnormal   02/11/22 2331 134/92 Abnormal   02/11/22 2316 144/96 Abnormal     Results for orders placed or performed during the hospital encounter of 02/11/22 (from the past 48 hour(s))  CBC with Differential/Platelet   Collection Time: 02/12/22 12:44 AM  Result Value Ref Range   WBC 13.6 (H) 4.0 - 10.5 K/uL   RBC 4.25 3.87 - 5.11 MIL/uL   Hemoglobin 12.2 12.0 - 15.0 g/dL   HCT 04/14/22 26.7 - 12.4 %   MCV 85.2 80.0 - 100.0 fL   MCH 28.7 26.0 - 34.0 pg   MCHC 33.7 30.0 - 36.0 g/dL   RDW 58.0 99.8 - 33.8 %   Platelets 194 150 - 400 K/uL   nRBC 0.0 0.0 -  0.2 %   Neutrophils Relative % 75 %   Neutro Abs 10.2 (H) 1.7 - 7.7 K/uL   Lymphocytes Relative 15 %   Lymphs Abs 2.1 0.7 - 4.0 K/uL   Monocytes Relative 9 %   Monocytes Absolute 1.2 (H) 0.1 - 1.0 K/uL   Eosinophils Relative 0 %   Eosinophils Absolute 0.1 0.0 - 0.5 K/uL   Basophils Relative 0 %   Basophils Absolute 0.0 0.0 - 0.1 K/uL   Immature Granulocytes 1 %   Abs Immature Granulocytes 0.07 0.00 - 0.07 K/uL  Comprehensive metabolic panel   Collection Time: 02/12/22 12:44 AM  Result Value Ref Range   Sodium 139 135 - 145 mmol/L   Potassium 4.3 3.5 - 5.1 mmol/L   Chloride 107 98 - 111 mmol/L   CO2 25 22 - 32 mmol/L   Glucose, Bld 84 70 - 99 mg/dL   BUN 11 6 - 20 mg/dL   Creatinine, Ser 1.30 0.44 - 1.00 mg/dL   Calcium 9.4 8.9 - 86.5 mg/dL   Total Protein 6.4 (L) 6.5 - 8.1 g/dL   Albumin 2.7  (L) 3.5 - 5.0 g/dL   AST 17 15 - 41 U/L   ALT 9 0 - 44 U/L   Alkaline Phosphatase 115 38 - 126 U/L   Total Bilirubin 0.3 0.3 - 1.2 mg/dL   GFR, Estimated >78 >46 mL/min   Anion gap 7 5 - 15  Protein / creatinine ratio, urine   Collection Time: 02/12/22 12:52 AM  Result Value Ref Range   Creatinine, Urine 209 mg/dL   Total Protein, Urine 222 mg/dL   Protein Creatinine Ratio 1.06 (H) 0.00 - 0.15 mg/mg[Cre]     Constitutional: NAD, AAOx3  HE/ENT: extraocular movements grossly intact, moist mucous membranes CV: RRR PULM: nl respiratory effort Abd: gravid, non-tender, non-distended, soft  Ext: Non-tender, Nonedmeatous Psych: mood appropriate, speech normal Pelvic : deferred  Fetal Monitor: Baseline: 135 bpm Variability: moderate Accels: Present Decels: none Toco: none Time: at least 20 minutes  Category: I NST: Reactive    Assessment: 21 y.o. [redacted]w[redacted]d here for antenatal surveillance during pregnancy.  Principle diagnosis: Preeclampsia without severe featurs   Plan: Labor: not present.  NST reviewed - reactive tracing  Fetal Wellbeing: Reassuring Cat 1 tracing. Several mild range blood pressures in triage and in clinic.  PCR now 1.2.  Meets criteria for preeclampsia without severe features.   I discussed risks of preeclampsia in pregnancy and recommend IOL.  She has requested to go home to set up childcare for her toddler. I discussed concerns of worsening of condition, seizure, stroke, and non-reassuring fetal status that could happen while she was arranging childcare.  I also reviewed that since her blood pressure had come down, she was asymptomatic, and current fetal tracing was reassuring that it was reasonable to go home and return today for an induction.   D/c home stable, precautions reviewed.  Will return today around 1400 for IOL for preeclampsia without severe features.   ----- Margaretmary Eddy, CNM Certified Nurse Midwife [facility]

## 2022-02-12 NOTE — H&P (Signed)
OB History & Physical   History of Present Illness:   Chief Complaint: IOL for preeclampsia   HPI:  Tara Higgins is a 21 y.o. G75P1001 female at [redacted]w[redacted]d, No LMP recorded. Patient is pregnant., not consistent with [place] at [redacted]w[redacted]d, with Estimated Date of Delivery: 03/03/22.  She presents to L&D for IOL d/t preeclampsia without severe features.  Albie had several mild range blood pressures in clinic and in L&D triage.  Urine PCR 1.2, other PreE labs were normal.  She reports a headache that started this evening, not severe.  Denies changes in vision or RUQ pain.  Reports active fetal movement  Contractions: denies  LOF/SROM: denies  Vaginal bleeding: denies   Factors complicating pregnancy:  Preeclampsia without severe features  Obesity in pregnancy GBS pos History of gestational hypertension  Maternal iron deficiency anemia in pregnancy  Elevated 1hr GTT - did not complete 3hr GCT   Patient Active Problem List   Diagnosis Date Noted   Elevated blood pressure affecting pregnancy in third trimester, antepartum 02/12/2022   Preeclampsia, third trimester 02/12/2022   Preeclampsia 02/12/2022   GBS bacteriuria 10/06/2021   Obesity affecting pregnancy in first trimester 08/30/2021   Supervision of high risk pregnancy in third trimester 08/07/2021   Anemia complicating pregnancy in third trimester 04/16/2020   Adjustment disorder with depressed mood 10/10/2019    Prenatal Transfer Tool  Maternal Diabetes: No Genetic Screening: Normal Maternal Ultrasounds/Referrals: Normal Fetal Ultrasounds or other Referrals:  None Maternal Substance Abuse:  No Significant Maternal Medications:  None Significant Maternal Lab Results: Group B Strep positive  Maternal Medical History:   Past Medical History:  Diagnosis Date   Anxiety    Suicidal ideation 10/2019    History reviewed. No pertinent surgical history.  No Known Allergies  Prior to Admission medications   Medication Sig Start Date End  Date Taking? Authorizing Provider  Prenatal Multivit-Min-Fe-FA (PRENATAL, W/IRON & FA,) 27-0.8 MG TABS Take 1 tablet by mouth daily. Patient not taking: Reported on 02/12/2022    [provider]     Prenatal care site:  [facility]  OB History  Gravida Para Term Preterm AB Living  2 1 1  0 0 1  SAB IAB Ectopic Multiple Live Births  0 0 0 0 0    # Outcome Date GA Lbr Len/2nd Weight Sex Delivery Anes PTL Lv  2 Current           1 Term              Social History: She  reports that she has never smoked. She has never used smokeless tobacco. She reports that she does not currently use alcohol. She reports that she does not currently use drugs.  Family History: family history includes Anxiety disorder in her mother; Depression in her mother; Drug abuse in her mother; Liver cancer in her maternal grandmother.   Review of Systems: A full review of systems was performed and negative except as noted in the HPI.     Physical Exam:  Vital Signs: BP 139/88   Pulse 94   Temp 97.9 F (36.6 C) (Oral)   Resp 16   Ht 5\' 4"  (1.626 m)   Wt 110.2 kg   BMI 41.71 kg/m   General: no acute distress.  HEENT: normocephalic, atraumatic Heart: regular rate & rhythm Lungs: normal respiratory effort Abdomen: soft, gravid, non-tender;  EFW: 6 1/2 lbs  Pelvic:   External: Normal external female genitalia  Cervix: Dilation: 1 /  Effacement (%): Thick /      Extremities: non-tender, symmetric, No edema bilaterally.  DTRs: 2+/2+  Neurologic: Alert & oriented x 3.    Results for orders placed or performed during the hospital encounter of 02/12/22 (from the past 24 hour(s))  CBC     Status: Abnormal   Collection Time: 02/12/22 10:09 PM  Result Value Ref Range   WBC 9.8 4.0 - 10.5 K/uL   RBC 4.22 3.87 - 5.11 MIL/uL   Hemoglobin 11.9 (L) 12.0 - 15.0 g/dL   HCT 73.2 (L) 20.2 - 54.2 %   MCV 84.4 80.0 - 100.0 fL   MCH 28.2 26.0 - 34.0 pg   MCHC 33.4 30.0 - 36.0 g/dL   RDW 70.6 23.7  - 62.8 %   Platelets 203 150 - 400 K/uL   nRBC 0.0 0.0 - 0.2 %  Type and screen     Status: None (Preliminary result)   Collection Time: 02/12/22 10:09 PM  Result Value Ref Range   ABO/RH(D) PENDING    Antibody Screen PENDING    Sample Expiration      02/15/2022,2359 Performed at [facility], [address], [place], [place] [address]     Pertinent Results:  Prenatal Labs: Blood type/Rh O pos  Antibody screen Negative    Rubella Immune    Varicella Immune  RPR NR  HBsAg NR  Hep C NR   HIV NR  GC neg  Chlamydia neg  Genetic screening cfDNA negative   1 hour GTT 153  3 hour GTT Did not complete   GBS Positive    FHT:  FHR: 140 bpm, variability: moderate,  accelerations:  Present,  decelerations:  Absent Category/reactivity:  Category I UC:   none   Cephalic by Leopolds and SVE  US done 02/09/2022 - Vertex presentation   No results found.  Assessment:  Anye Brose is a 21 y.o. G33P1001 female at [redacted]w[redacted]d with Preeclampsia without severe features.   Plan:  1. Admit to Labor & Delivery - consents reviewed and obtained - Dr. Jean Rosenthal notified of admission and plan of care   2. Fetal Well being  - Fetal Tracing: 1 - Group B Streptococcus ppx indicated: GBS positive - Presentation: cephalic confirmed by vertex   3. Routine OB: - Prenatal labs reviewed, as above - Rh positive - CBC, T&S, RPR on admit - Regular diet, saline lock  4. Induction of labor  - Contractions monitored with external toco - Pelvis adequate for trial of labor  - Plan for induction with misoprostol  - Induction with oxytocin, AROM, and cervical balloon as appropriate  - Plan for  continuous fetal monitoring - Maternal pain control as desired - Anticipate vaginal delivery  5. Post Partum Planning: - Infant feeding: formula feeding - Contraception:  TBD - Tdap vaccine: Given prenatally - 12/14/2021 - Flu vaccine:  Due in season  Gustavo Lah, [place] 02/12/22 11:16 PM  Margaretmary Eddy, CNM Certified Nurse Midwife [facility]

## 2022-02-13 LAB — ABO/RH: ABO/RH(D): O POS

## 2022-02-13 LAB — RPR: RPR Ser Ql: NONREACTIVE

## 2022-02-13 MED ORDER — NIFEDIPINE ER OSMOTIC RELEASE 30 MG PO TB24
30.0000 mg | ORAL_TABLET | Freq: Every day | ORAL | Status: DC
  Administered 2022-02-14: 30 mg via ORAL
  Filled 2022-02-13: qty 1

## 2022-02-13 MED ORDER — OXYTOCIN 10 UNIT/ML IJ SOLN
INTRAMUSCULAR | Status: AC
  Filled 2022-02-13: qty 2

## 2022-02-13 MED ORDER — MISOPROSTOL 200 MCG PO TABS
ORAL_TABLET | ORAL | Status: AC
  Filled 2022-02-13: qty 4

## 2022-02-13 MED ORDER — LIDOCAINE HCL (PF) 1 % IJ SOLN
INTRAMUSCULAR | Status: AC
  Filled 2022-02-13: qty 30

## 2022-02-13 MED ORDER — AMMONIA AROMATIC IN INHA
RESPIRATORY_TRACT | Status: AC
  Filled 2022-02-13: qty 10

## 2022-02-13 MED ORDER — ZOLPIDEM TARTRATE 5 MG PO TABS: 5.0000 mg | ORAL_TABLET | Freq: Every evening | ORAL | Status: DC | PRN

## 2022-02-13 MED ORDER — DIBUCAINE (PERIANAL) 1 % EX OINT: 1.0000 | TOPICAL_OINTMENT | CUTANEOUS | Status: DC | PRN

## 2022-02-13 MED ORDER — SIMETHICONE 80 MG PO CHEW: 80.0000 mg | CHEWABLE_TABLET | ORAL | Status: DC | PRN

## 2022-02-13 MED ORDER — ONDANSETRON HCL 4 MG/2ML IJ SOLN: 4.0000 mg | INTRAMUSCULAR | Status: DC | PRN

## 2022-02-13 MED ORDER — COCONUT OIL OIL: 1.0000 | TOPICAL_OIL | Status: DC | PRN

## 2022-02-13 MED ORDER — ACETAMINOPHEN 325 MG PO TABS
650.0000 mg | ORAL_TABLET | ORAL | Status: DC | PRN
  Administered 2022-02-13 – 2022-02-14 (×3): 650 mg via ORAL
  Filled 2022-02-13 (×3): qty 2

## 2022-02-13 MED ORDER — WITCH HAZEL-GLYCERIN EX PADS: 1.0000 | MEDICATED_PAD | CUTANEOUS | Status: DC | PRN

## 2022-02-13 MED ORDER — IBUPROFEN 600 MG PO TABS
600.0000 mg | ORAL_TABLET | Freq: Four times a day (QID) | ORAL | Status: DC
  Administered 2022-02-13 – 2022-02-14 (×3): 600 mg via ORAL
  Filled 2022-02-13 (×3): qty 1

## 2022-02-13 MED ORDER — ONDANSETRON HCL 4 MG PO TABS: 4.0000 mg | ORAL_TABLET | ORAL | Status: DC | PRN

## 2022-02-13 MED ORDER — DIPHENHYDRAMINE HCL 25 MG PO CAPS: 25.0000 mg | ORAL_CAPSULE | Freq: Four times a day (QID) | ORAL | Status: DC | PRN

## 2022-02-13 MED ORDER — PRENATAL MULTIVITAMIN CH
1.0000 | ORAL_TABLET | Freq: Every day | ORAL | Status: DC
  Administered 2022-02-13 – 2022-02-14 (×2): 1 via ORAL
  Filled 2022-02-13 (×2): qty 1

## 2022-02-13 MED ORDER — BENZOCAINE-MENTHOL 20-0.5 % EX AERO
1.0000 | INHALATION_SPRAY | CUTANEOUS | Status: DC | PRN
  Filled 2022-02-13: qty 56

## 2022-02-13 MED ORDER — SENNOSIDES-DOCUSATE SODIUM 8.6-50 MG PO TABS
2.0000 | ORAL_TABLET | Freq: Every day | ORAL | Status: DC
  Filled 2022-02-13: qty 2

## 2022-02-13 MED ORDER — NIFEDIPINE ER OSMOTIC RELEASE 30 MG PO TB24
ORAL_TABLET | ORAL | Status: AC
  Administered 2022-02-13: 30 mg via ORAL
  Filled 2022-02-13: qty 1

## 2022-02-13 NOTE — Discharge Summary (Signed)
Obstetrical Discharge Summary  Patient Name: Tara Higgins DOB: January 16, 2001 MRN: [id number]  Date of Admission: 02/12/2022 Date of Delivery: 02/13/2022 Delivered by: Donato Schultz, CNM Date of Discharge: 02/14/2022  Primary OB: [facility]  LMP:No LMP recorded (lmp unknown). Patient is pregnant. EDC Estimated Date of Delivery: 03/03/22 Gestational Age at Delivery: [redacted]w[redacted]d   Antepartum complications:  Preeclampsia without severe features  Obesity in pregnancy GBS pos History of gestational hypertension  Maternal iron deficiency anemia in pregnancy  Elevated 1hr GTT - did not complete 3hr GCT  Admitting Diagnosis: IOL for pre-eclampsia Secondary Diagnosis: NSVD, precipitous delivery Patient Active Problem List   Diagnosis Date Noted   Elevated blood pressure affecting pregnancy in third trimester, antepartum 02/12/2022   Preeclampsia, third trimester 02/12/2022   Preeclampsia 02/12/2022   GBS bacteriuria 10/06/2021   Obesity affecting pregnancy in first trimester 08/30/2021   Supervision of high risk pregnancy in third trimester 08/07/2021   Anemia complicating pregnancy in third trimester 04/16/2020   Adjustment disorder with depressed mood 10/10/2019    Augmentation: Cytotec x2 Complications: None Intrapartum complications/course: She arrived for IOL for pre-eclampsia without severe features. She received two doses of cytotec, then SROM and precipitously delivered viable female infant over intact perineum. Apgars 8/9. Date of Delivery: 02/13/2022 Delivered By: Donato Schultz, CNM Delivery Type: spontaneous vaginal delivery Anesthesia: IV fentanyl Placenta: spontaneous Laceration: none Episiotomy: none Newborn Data: Live born female "Noah" Birth Weight:  7lb 12.2oz APGAR: 8, 9  Newborn Delivery   Birth date/time: 02/13/2022 09:46:00 Delivery type: Vaginal, Spontaneous      Postpartum Procedures: none [place]:      No data to display           Post  partum course:   Patient had an uncomplicated postpartum course.  By time of discharge on PPD#1, her pain was controlled on oral pain medications; she had appropriate lochia and was ambulating, voiding without difficulty and tolerating regular diet.  She was deemed stable for discharge to home.    Discharge Physical Exam:   BP 123/80 (BP Location: Left Arm)   Pulse 89   Temp 97.8 F (36.6 C) (Oral)   Resp 18   Ht 5\' 4"  (1.626 m)   Wt 110.2 kg   LMP  (LMP Unknown)   SpO2 98%   BMI 41.71 kg/m   General: NAD CV: RRR Pulm: CTABL, nl effort ABD: s/nd/nt, fundus firm and below the umbilicus Lochia: moderate Perineum: intact DVT Evaluation: LE non-ttp, no evidence of DVT on exam.  Hemoglobin  Date Value Ref Range Status  02/14/2022 10.4 (L) 12.0 - 15.0 g/dL Final   HCT  Date Value Ref Range Status  02/14/2022 31.9 (L) 36.0 - 46.0 % Final     Disposition: stable, discharge to home. Baby Feeding: breastmilk and formula Baby Disposition: home with mom  Rh Immune globulin given: n/a, O positive Rubella vaccine given: immune Varicella vaccine given: immune Tdap vaccine given in AP or PP setting: 12/14/2021 Flu vaccine given in AP or PP setting: give in season  Contraception: TBD  Prenatal Labs:  Blood type/Rh O pos  Antibody screen Negative    Rubella Immune    Varicella Immune  RPR NR  HBsAg NR  Hep C NR   HIV NR  GC neg  Chlamydia neg  Genetic screening cfDNA negative   1 hour GTT 153  3 hour GTT Did not complete   GBS Positive      Plan:  [name] was discharged to  home in good condition. Call the office this week for BP check. Follow-up appointment with delivering provider in 6 weeks.  Discharge Medications: Allergies as of 02/14/2022   No Known Allergies      Medication List     TAKE these medications    acetaminophen 325 MG tablet Commonly known as: Tylenol Take 2 tablets (650 mg total) by mouth every 4 (four) hours as needed (for pain  scale < 4).   benzocaine-Menthol 20-0.5 % Aero Commonly known as: DERMOPLAST Apply 1 Application topically as needed for irritation (perineal discomfort).   ibuprofen 600 MG tablet Commonly known as: ADVIL Take 1 tablet (600 mg total) by mouth every 6 (six) hours.   NIFEdipine 30 MG 24 hr tablet Commonly known as: ADALAT CC Take 1 tablet (30 mg total) by mouth daily.   Prenatal (w/Iron & FA) 27-0.8 MG Tabs Take 1 tablet by mouth daily.   witch hazel-glycerin pad Commonly known as: TUCKS Apply 1 Application topically as needed for hemorrhoids.         Follow-up Information     Janyce Llanos, CNM Follow up in 6 week(s).   Specialty: Certified Nurse Midwife Why: 6wk postpartum Contact information: [address] [place] [address] [phone]         [facility] Follow up in 2 day(s).   Why: BP check Contact information: [address] [place] [address] [phone]                Signed:  Janyce Llanos, CNM 02/14/2022 9:28 AM

## 2022-02-13 NOTE — Lactation Note (Signed)
This note was copied from a baby's chart. Lactation Consultation Note  Patient Name: Boy Shakeerah Gradel [id number] Date: 02/13/2022 Reason for consult: Initial assessment;Early term 37-38.6wks Age:21 hours  Maternal Data Has patient been taught Hand Expression?: Yes Does the patient have breastfeeding experience prior to this delivery?: Yes How long did the patient breastfeed?: 1 day (attempted with first)  Feeding Mother's Current Feeding Choice: Breast Milk and Formula Mom gave formula at last feeding, no experience with breastfeeding, may want to pump breasts but willing to try breastfeeding in hospital, concerned because she can't see how much milk baby is taking, reviewed how milk is made, baby latches easily to breast with shaping of breast tissue and is nursing well, when comes off breast, latches back easily, mom encouraged.   LATCH Score Latch: Grasps breast easily, tongue down, lips flanged, rhythmical sucking.  Audible Swallowing: A few with stimulation  Type of Nipple: Everted at rest and after stimulation  Comfort (Breast/Nipple): Soft / non-tender  Hold (Positioning): Assistance needed to correctly position infant at breast and maintain latch.  LATCH Score: 8   Lactation Tools Discussed/Used  LC name and no written on white board  Interventions Interventions: Breast feeding basics reviewed;Assisted with latch;Hand express;Breast compression;Adjust position;Support pillows;Education (purelan given with instruction in use)  Discharge Pump: Personal WIC Program: Yes  Consult Status Consult Status: PRN    Dyann Kief 02/13/2022, 5:55 PM

## 2022-02-14 LAB — CBC
HCT: 31.9 % — ABNORMAL LOW (ref 36.0–46.0)
Hemoglobin: 10.4 g/dL — ABNORMAL LOW (ref 12.0–15.0)
MCH: 27.9 pg (ref 26.0–34.0)
MCHC: 32.6 g/dL (ref 30.0–36.0)
MCV: 85.5 fL (ref 80.0–100.0)
Platelets: 155 10*3/uL (ref 150–400)
RBC: 3.73 MIL/uL — ABNORMAL LOW (ref 3.87–5.11)
RDW: 13 % (ref 11.5–15.5)
WBC: 9 10*3/uL (ref 4.0–10.5)
nRBC: 0 % (ref 0.0–0.2)

## 2022-02-14 MED ORDER — IBUPROFEN 600 MG PO TABS: 600.0000 mg | ORAL_TABLET | Freq: Four times a day (QID) | ORAL | 0 refills | Status: AC

## 2022-02-14 MED ORDER — WITCH HAZEL-GLYCERIN EX PADS: 1.0000 | MEDICATED_PAD | CUTANEOUS | 12 refills | Status: DC | PRN

## 2022-02-14 MED ORDER — NIFEDIPINE ER 30 MG PO TB24: 30.0000 mg | ORAL_TABLET | Freq: Every day | ORAL | 2 refills | Status: DC

## 2022-02-14 MED ORDER — ACETAMINOPHEN 325 MG PO TABS: 650.0000 mg | ORAL_TABLET | ORAL | Status: AC | PRN

## 2022-02-14 MED ORDER — BENZOCAINE-MENTHOL 20-0.5 % EX AERO: 1.0000 | INHALATION_SPRAY | CUTANEOUS | Status: DC | PRN

## 2022-02-14 MED ORDER — IBUPROFEN 600 MG PO TABS
600.0000 mg | ORAL_TABLET | Freq: Four times a day (QID) | ORAL | Status: DC
  Administered 2022-02-14 (×2): 600 mg via ORAL
  Filled 2022-02-14 (×2): qty 1

## 2022-02-14 NOTE — Progress Notes (Signed)
All Doctors Discharge instructions reviewed with patient; patient verbalized understanding of same; copy given.

## 2022-02-14 NOTE — Progress Notes (Signed)
Patient discharged to home. Patient left 3rd floor via wheelchair with her infant in her arms accompanied by father of baby and Marlowe Shores RN.

## 2022-02-16 DIAGNOSIS — N3000 Acute cystitis without hematuria: Secondary | ICD-10-CM | POA: Diagnosis not present

## 2022-02-16 DIAGNOSIS — O1495 Unspecified pre-eclampsia, complicating the puerperium: Secondary | ICD-10-CM | POA: Diagnosis not present

## 2022-02-16 DIAGNOSIS — H538 Other visual disturbances: Secondary | ICD-10-CM | POA: Diagnosis not present

## 2022-02-16 DIAGNOSIS — B9620 Unspecified Escherichia coli [E. coli] as the cause of diseases classified elsewhere: Secondary | ICD-10-CM | POA: Diagnosis not present

## 2022-02-16 DIAGNOSIS — O8622 Infection of bladder following delivery: Secondary | ICD-10-CM | POA: Diagnosis not present

## 2022-02-16 DIAGNOSIS — R519 Headache, unspecified: Secondary | ICD-10-CM | POA: Diagnosis not present

## 2022-02-17 DIAGNOSIS — O1415 Severe pre-eclampsia, complicating the puerperium: Secondary | ICD-10-CM | POA: Diagnosis not present

## 2022-02-17 DIAGNOSIS — O1495 Unspecified pre-eclampsia, complicating the puerperium: Secondary | ICD-10-CM | POA: Diagnosis not present

## 2022-02-17 DIAGNOSIS — O8622 Infection of bladder following delivery: Secondary | ICD-10-CM | POA: Diagnosis not present

## 2022-02-17 DIAGNOSIS — B9620 Unspecified Escherichia coli [E. coli] as the cause of diseases classified elsewhere: Secondary | ICD-10-CM | POA: Diagnosis not present

## 2022-02-17 DIAGNOSIS — R519 Headache, unspecified: Secondary | ICD-10-CM | POA: Diagnosis not present

## 2022-02-17 DIAGNOSIS — Z79899 Other long term (current) drug therapy: Secondary | ICD-10-CM | POA: Diagnosis not present

## 2022-02-17 DIAGNOSIS — O99893 Other specified diseases and conditions complicating puerperium: Secondary | ICD-10-CM | POA: Diagnosis not present

## 2022-02-17 DIAGNOSIS — N3000 Acute cystitis without hematuria: Secondary | ICD-10-CM | POA: Diagnosis not present

## 2022-02-18 DIAGNOSIS — O1415 Severe pre-eclampsia, complicating the puerperium: Secondary | ICD-10-CM | POA: Diagnosis not present

## 2022-02-19 DIAGNOSIS — O1415 Severe pre-eclampsia, complicating the puerperium: Secondary | ICD-10-CM | POA: Diagnosis not present

## 2022-02-20 DIAGNOSIS — O99893 Other specified diseases and conditions complicating puerperium: Secondary | ICD-10-CM | POA: Diagnosis not present

## 2022-02-20 DIAGNOSIS — R519 Headache, unspecified: Secondary | ICD-10-CM | POA: Diagnosis not present

## 2022-02-22 DIAGNOSIS — O135 Gestational [pregnancy-induced] hypertension without significant proteinuria, complicating the puerperium: Secondary | ICD-10-CM | POA: Diagnosis not present

## 2022-03-05 ENCOUNTER — Telehealth: Payer: Self-pay | Admitting: Obstetrics and Gynecology

## 2022-03-05 ENCOUNTER — Encounter: Payer: Self-pay | Admitting: Obstetrics and Gynecology

## 2022-03-05 NOTE — Telephone Encounter (Signed)
Reached out via phone to schedule circumcision for her son. No answer, no emergency contacts. Sent letter via mail.

## 2022-03-21 ENCOUNTER — Encounter: Payer: Self-pay | Admitting: Emergency Medicine

## 2022-03-21 ENCOUNTER — Emergency Department: Payer: Medicaid Other

## 2022-03-21 ENCOUNTER — Emergency Department
Admission: EM | Admit: 2022-03-21 | Discharge: 2022-03-21 | Disposition: A | Discharge status: Home / Self Care | Payer: Medicaid Other | Attending: Emergency Medicine | Admitting: Emergency Medicine

## 2022-03-21 ENCOUNTER — Other Ambulatory Visit: Payer: Self-pay

## 2022-03-21 DIAGNOSIS — R0602 Shortness of breath: Secondary | ICD-10-CM | POA: Diagnosis not present

## 2022-03-21 DIAGNOSIS — R519 Headache, unspecified: Secondary | ICD-10-CM | POA: Diagnosis not present

## 2022-03-21 DIAGNOSIS — R079 Chest pain, unspecified: Secondary | ICD-10-CM | POA: Diagnosis not present

## 2022-03-21 DIAGNOSIS — K449 Diaphragmatic hernia without obstruction or gangrene: Secondary | ICD-10-CM | POA: Diagnosis not present

## 2022-03-21 DIAGNOSIS — K760 Fatty (change of) liver, not elsewhere classified: Secondary | ICD-10-CM | POA: Diagnosis not present

## 2022-03-21 LAB — URINALYSIS, ROUTINE W REFLEX MICROSCOPIC
Bilirubin Urine: NEGATIVE
Glucose, UA: NEGATIVE mg/dL
Hgb urine dipstick: NEGATIVE
Ketones, ur: NEGATIVE mg/dL
Leukocytes,Ua: NEGATIVE
Nitrite: NEGATIVE
Protein, ur: NEGATIVE mg/dL
Specific Gravity, Urine: 1.009 (ref 1.005–1.030)
pH: 7 (ref 5.0–8.0)

## 2022-03-21 LAB — COMPREHENSIVE METABOLIC PANEL
ALT: 21 U/L (ref 0–44)
AST: 25 U/L (ref 15–41)
Albumin: 4.4 g/dL (ref 3.5–5.0)
Alkaline Phosphatase: 93 U/L (ref 38–126)
Anion gap: 10 (ref 5–15)
BUN: 10 mg/dL (ref 6–20)
CO2: 25 mmol/L (ref 22–32)
Calcium: 9.5 mg/dL (ref 8.9–10.3)
Chloride: 104 mmol/L (ref 98–111)
Creatinine, Ser: 0.69 mg/dL (ref 0.44–1.00)
GFR, Estimated: >60 mL/min (ref >60)
Glucose, Bld: 112 mg/dL — ABNORMAL HIGH (ref 70–99)
Potassium: 3.9 mmol/L (ref 3.5–5.1)
Sodium: 139 mmol/L (ref 135–145)
Total Bilirubin: 0.8 mg/dL (ref 0.3–1.2)
Total Protein: 7.9 g/dL (ref 6.5–8.1)

## 2022-03-21 LAB — CBC WITH DIFFERENTIAL/PLATELET
Abs Immature Granulocytes: 0.02 10*3/uL (ref 0.00–0.07)
Basophils Absolute: 0.1 10*3/uL (ref 0.0–0.1)
Basophils Relative: 1 %
Eosinophils Absolute: 0.1 10*3/uL (ref 0.0–0.5)
Eosinophils Relative: 2 %
HCT: 42.6 % (ref 36.0–46.0)
Hemoglobin: 13.9 g/dL (ref 12.0–15.0)
Immature Granulocytes: 0 %
Lymphocytes Relative: 25 %
Lymphs Abs: 2 10*3/uL (ref 0.7–4.0)
MCH: 27.4 pg (ref 26.0–34.0)
MCHC: 32.6 g/dL (ref 30.0–36.0)
MCV: 84 fL (ref 80.0–100.0)
Monocytes Absolute: 0.6 10*3/uL (ref 0.1–1.0)
Monocytes Relative: 8 %
Neutro Abs: 5.1 10*3/uL (ref 1.7–7.7)
Neutrophils Relative %: 64 %
Platelets: 258 10*3/uL (ref 150–400)
RBC: 5.07 MIL/uL (ref 3.87–5.11)
RDW: 12.3 % (ref 11.5–15.5)
WBC: 7.9 10*3/uL (ref 4.0–10.5)
nRBC: 0 % (ref 0.0–0.2)

## 2022-03-21 LAB — LIPASE, BLOOD: Lipase: 34 U/L (ref 11–51)

## 2022-03-21 MED ORDER — ACETAMINOPHEN 325 MG PO TABS
650.0000 mg | ORAL_TABLET | Freq: Once | ORAL | Status: AC
  Administered 2022-03-21: 650 mg via ORAL
  Filled 2022-03-21: qty 2

## 2022-03-21 MED ORDER — IOHEXOL 350 MG/ML SOLN
75.0000 mL | Freq: Once | INTRAVENOUS | Status: AC | PRN
  Administered 2022-03-21: 75 mL via INTRAVENOUS

## 2022-03-21 NOTE — ED Triage Notes (Signed)
Pt to ED from home c/o SOB starting 1hr PTA, feeling weak and a headache.  Denies cough, n/v/d, or urinary changes.  Pt is 4wks postpartum.  States had HTN during pregnancy and been on BP medications since delivery.  Pt A&Ox4, chest rise even and unlabored, skin WNL and in NAD at this time.  PA in triage room for MSE.

## 2022-03-21 NOTE — Discharge Instructions (Addendum)
Please continue to follow-up with your OB doctor.  Return here for worsening headache or any further shortness of breath.  Use the Tylenol as needed for your headaches.  Rest and take it easy.

## 2022-03-21 NOTE — ED Provider Triage Note (Signed)
Emergency Medicine Provider Triage Evaluation Note  Tara Higgins , a 21 y.o. female G2P2  was evaluated in triage.  Pt complains of SOB that began 1 hour ago. Also reports headache and weakness. Is 4 weeks post partum, had gestational hypertension and pre-eclampsia, been on BP meds since delivery but denies problems since delivery. Reports blurry vision since delivery. Required magnesium after delivery. Denies leg swelling  Review of Systems  Positive: Headaches, blurry vision Negative: Abd pain, leg swelling  Physical Exam  There were no vitals taken for this visit. Gen:   Awake, no distress   Resp:  Normal effort  MSK:   Moves extremities without difficulty  Other:    Medical Decision Making  Medically screening exam initiated at 12:54 PM.  Appropriate orders placed.  Nalini Alcaraz was informed that the remainder of the evaluation will be completed by another provider, this initial triage assessment does not replace that evaluation, and the importance of remaining in the ED until their evaluation is complete.     Marquette Old, PA-C 03/21/22 1300

## 2022-03-21 NOTE — ED Provider Notes (Signed)
[facility] Provider Note    Event Date/Time   First MD Initiated Contact with Patient 03/21/22 1425     (approximate)   History   Shortness of Breath   HPI  Tara Higgins is a 21 y.o. female reported she was short of breath and had a bad headache.  She had preeclampsia prior to delivery and was worried about that.  She came in to be checked.  She has a history of migraine headaches and other headaches as well.  Headache is better now.  Shortness of breath is gone away completely she says.  She is now on blood pressure medication.      Physical Exam   Triage Vital Signs: ED Triage Vitals  Enc Vitals Group     BP 03/21/22 1257 132/81     Pulse Rate 03/21/22 1257 (!) 115     Resp 03/21/22 1257 18     Temp 03/21/22 1257 97.9 F (36.6 C)     Temp Source 03/21/22 1257 Oral     SpO2 03/21/22 1257 100 %     Weight 03/21/22 1300 210 lb (95.3 kg)     Height 03/21/22 1300 5\' 2"  (1.575 m)     Head Circumference --      Peak Flow --      Pain Score 03/21/22 1300 7     Pain Loc --      Pain Edu? --      Excl. in GC? --     Most recent vital signs: Vitals:   03/21/22 1257 03/21/22 1511  BP: 132/81 126/80  Pulse: (!) 115 100  Resp: 18 18  Temp: 97.9 F (36.6 C) 97.9 F (36.6 C)  SpO2: 100% 98%     General: Awake, no distress.  Head normocephalic atraumatic Eyes pupils equal round fundi look normal CV:  Good peripheral perfusion.  Heart regular rate and rhythm no audible murmurs Resp:  Normal effort.  Lungs are clear Extremities no edema   ED Results / Procedures / Treatments   Labs (all labs ordered are listed, but only abnormal results are displayed) Labs Reviewed  URINALYSIS, ROUTINE W REFLEX MICROSCOPIC - Abnormal; Notable for the following components:      Result Value   Color, Urine YELLOW (*)    APPearance CLEAR (*)    All other components within normal limits  COMPREHENSIVE METABOLIC PANEL - Abnormal; Notable for the following  components:   Glucose, Bld 112 (*)    All other components within normal limits  CBC WITH DIFFERENTIAL/PLATELET  LIPASE, BLOOD     EKG  EKG read and interpreted by me shows normal sinus rhythm rate of 76 there is right axis no acute ST-T wave changes low voltage   RADIOLOGY Chest x-ray read by radiology reviewed and interpreted by me as negative Patient refused her ultrasound.  She says the leg feels better.  She says her father has a DVT and she knows all about the risks of DVT.  She knows about the risk of dying with her DVT.  Patient's chest CT showed no acute pneumonia or pneumothorax or PE.  Radiology read the film I reviewed it and interpreted.  PROCEDURES:  Critical Care performed:   Procedures   MEDICATIONS ORDERED IN ED: Medications  acetaminophen (TYLENOL) tablet 650 mg (has no administration in time range)  iohexol (OMNIPAQUE) 350 MG/ML injection 75 mL (75 mLs Intravenous Contrast Given 03/21/22 1527)     IMPRESSION / MDM / ASSESSMENT  AND PLAN / ED COURSE  I reviewed the triage vital signs and the nursing notes. Patient with a past history of preeclampsia but her blood pressure is now good.  Blood pressures after the original 1 taken in triage have been in the 120s 107 even 1 of 90 for 5 systolic.  Patient feels better.  Headache is better.  Shortness of breath has not recurred.  Differential diagnosis includes, but is not limited to, most worrisome would be return of the preeclampsia but there is no difficulty with her platelet count or her LFTs or her renal function and there is no protein in her urine and blood pressures been good.  I do not think that is a problem.  I will think she has any kind of hypertensive urgency.  Her heart rate has come down as well.  O2 sats are good she is not tachycardic or tachypneic any longer.  And she has no pleuritic chest pain or further shortness of breath.  Patient's presentation is most consistent with acute complicated illness /  injury requiring diagnostic workup.  Patient wants to go home and rest because her headache is worsening somewhat again.  I think this will be okay.  Her blood pressures been good her fundus has been good headache is waxing and waning but never getting worse than it has in the past.      FINAL CLINICAL IMPRESSION(S) / ED DIAGNOSES   Final diagnoses:  Nonintractable headache, unspecified chronicity pattern, unspecified headache type     Rx / DC Orders   ED Discharge Orders     None        Note:  This document was prepared using Dragon voice recognition software and may include unintentional dictation errors.   Nena Polio, MD 03/21/22 [phone]

## 2022-03-31 DIAGNOSIS — Z1332 Encounter for screening for maternal depression: Secondary | ICD-10-CM | POA: Diagnosis not present

## 2022-08-31 DIAGNOSIS — Z23 Encounter for immunization: Secondary | ICD-10-CM | POA: Diagnosis not present

## 2022-08-31 DIAGNOSIS — S51012A Laceration without foreign body of left elbow, initial encounter: Secondary | ICD-10-CM | POA: Diagnosis not present

## 2022-08-31 DIAGNOSIS — Z79899 Other long term (current) drug therapy: Secondary | ICD-10-CM | POA: Diagnosis not present

## 2022-09-07 DIAGNOSIS — Z136 Encounter for screening for cardiovascular disorders: Secondary | ICD-10-CM | POA: Diagnosis not present

## 2022-09-07 DIAGNOSIS — R519 Headache, unspecified: Secondary | ICD-10-CM | POA: Diagnosis not present

## 2022-09-07 DIAGNOSIS — R42 Dizziness and giddiness: Secondary | ICD-10-CM | POA: Diagnosis not present

## 2022-09-09 DIAGNOSIS — Z4802 Encounter for removal of sutures: Secondary | ICD-10-CM | POA: Diagnosis not present

## 2022-09-10 DIAGNOSIS — L03312 Cellulitis of back [any part except buttock]: Secondary | ICD-10-CM | POA: Diagnosis not present

## 2022-09-10 DIAGNOSIS — Z4802 Encounter for removal of sutures: Secondary | ICD-10-CM | POA: Diagnosis not present

## 2022-09-10 DIAGNOSIS — F419 Anxiety disorder, unspecified: Secondary | ICD-10-CM | POA: Diagnosis not present

## 2022-09-10 DIAGNOSIS — Z792 Long term (current) use of antibiotics: Secondary | ICD-10-CM | POA: Diagnosis not present

## 2022-09-17 ENCOUNTER — Ambulatory Visit (LOCAL_COMMUNITY_HEALTH_CENTER): Payer: Self-pay

## 2022-09-17 ENCOUNTER — Other Ambulatory Visit: Payer: Self-pay

## 2022-09-17 DIAGNOSIS — Z111 Encounter for screening for respiratory tuberculosis: Secondary | ICD-10-CM

## 2022-09-20 ENCOUNTER — Ambulatory Visit (LOCAL_COMMUNITY_HEALTH_CENTER): Payer: Self-pay

## 2022-09-20 DIAGNOSIS — Z111 Encounter for screening for respiratory tuberculosis: Secondary | ICD-10-CM

## 2022-09-20 LAB — TB SKIN TEST
Induration: 0 mm
TB Skin Test: NEGATIVE

## 2022-09-23 DIAGNOSIS — D729 Disorder of white blood cells, unspecified: Secondary | ICD-10-CM | POA: Diagnosis not present

## 2022-09-23 DIAGNOSIS — R030 Elevated blood-pressure reading, without diagnosis of hypertension: Secondary | ICD-10-CM | POA: Diagnosis not present

## 2022-10-15 DIAGNOSIS — R059 Cough, unspecified: Secondary | ICD-10-CM | POA: Diagnosis not present

## 2022-10-15 DIAGNOSIS — F419 Anxiety disorder, unspecified: Secondary | ICD-10-CM | POA: Diagnosis not present

## 2022-10-15 DIAGNOSIS — J209 Acute bronchitis, unspecified: Secondary | ICD-10-CM | POA: Diagnosis not present

## 2022-10-15 DIAGNOSIS — R918 Other nonspecific abnormal finding of lung field: Secondary | ICD-10-CM | POA: Diagnosis not present

## 2022-10-15 DIAGNOSIS — R0789 Other chest pain: Secondary | ICD-10-CM | POA: Diagnosis not present

## 2022-10-15 DIAGNOSIS — R0782 Intercostal pain: Secondary | ICD-10-CM | POA: Diagnosis not present

## 2022-10-15 DIAGNOSIS — J40 Bronchitis, not specified as acute or chronic: Secondary | ICD-10-CM | POA: Diagnosis not present

## 2022-10-15 DIAGNOSIS — Z79899 Other long term (current) drug therapy: Secondary | ICD-10-CM | POA: Diagnosis not present

## 2022-10-15 DIAGNOSIS — R0781 Pleurodynia: Secondary | ICD-10-CM | POA: Diagnosis not present

## 2022-11-05 DIAGNOSIS — F419 Anxiety disorder, unspecified: Secondary | ICD-10-CM | POA: Diagnosis not present

## 2022-11-05 DIAGNOSIS — Z8759 Personal history of other complications of pregnancy, childbirth and the puerperium: Secondary | ICD-10-CM | POA: Diagnosis not present

## 2022-11-05 DIAGNOSIS — Z79899 Other long term (current) drug therapy: Secondary | ICD-10-CM | POA: Diagnosis not present

## 2022-11-05 DIAGNOSIS — K120 Recurrent oral aphthae: Secondary | ICD-10-CM | POA: Diagnosis not present

## 2023-01-11 DIAGNOSIS — F419 Anxiety disorder, unspecified: Secondary | ICD-10-CM | POA: Diagnosis not present

## 2023-01-11 DIAGNOSIS — M545 Low back pain, unspecified: Secondary | ICD-10-CM | POA: Diagnosis not present

## 2023-07-19 DIAGNOSIS — Z0001 Encounter for general adult medical examination with abnormal findings: Secondary | ICD-10-CM | POA: Diagnosis not present

## 2023-07-19 DIAGNOSIS — Z131 Encounter for screening for diabetes mellitus: Secondary | ICD-10-CM | POA: Diagnosis not present

## 2023-07-19 DIAGNOSIS — G43009 Migraine without aura, not intractable, without status migrainosus: Secondary | ICD-10-CM | POA: Diagnosis not present

## 2023-07-19 DIAGNOSIS — M79645 Pain in left finger(s): Secondary | ICD-10-CM | POA: Diagnosis not present

## 2023-07-19 DIAGNOSIS — D509 Iron deficiency anemia, unspecified: Secondary | ICD-10-CM | POA: Diagnosis not present

## 2023-07-19 DIAGNOSIS — Z1330 Encounter for screening examination for mental health and behavioral disorders, unspecified: Secondary | ICD-10-CM | POA: Diagnosis not present

## 2023-07-19 DIAGNOSIS — Z136 Encounter for screening for cardiovascular disorders: Secondary | ICD-10-CM | POA: Diagnosis not present

## 2023-07-19 DIAGNOSIS — Z1331 Encounter for screening for depression: Secondary | ICD-10-CM | POA: Diagnosis not present

## 2023-07-19 DIAGNOSIS — Z130 Encounter for screening for diseases of the blood and blood-forming organs and certain disorders involving the immune mechanism: Secondary | ICD-10-CM | POA: Diagnosis not present

## 2023-07-19 DIAGNOSIS — Z13220 Encounter for screening for lipoid disorders: Secondary | ICD-10-CM | POA: Diagnosis not present

## 2023-07-22 DIAGNOSIS — S6992XA Unspecified injury of left wrist, hand and finger(s), initial encounter: Secondary | ICD-10-CM | POA: Diagnosis not present

## 2023-07-22 DIAGNOSIS — M79645 Pain in left finger(s): Secondary | ICD-10-CM | POA: Diagnosis not present

## 2023-11-04 DIAGNOSIS — I10 Essential (primary) hypertension: Secondary | ICD-10-CM | POA: Diagnosis not present

## 2023-11-04 DIAGNOSIS — J069 Acute upper respiratory infection, unspecified: Secondary | ICD-10-CM | POA: Diagnosis not present

## 2023-11-04 DIAGNOSIS — F419 Anxiety disorder, unspecified: Secondary | ICD-10-CM | POA: Diagnosis not present

## 2023-11-13 ENCOUNTER — Ambulatory Visit
Admission: EM | Admit: 2023-11-13 | Discharge: 2023-11-13 | Disposition: A | Discharge status: Home / Self Care | Attending: Emergency Medicine | Admitting: Emergency Medicine

## 2023-11-13 DIAGNOSIS — H66003 Acute suppurative otitis media without spontaneous rupture of ear drum, bilateral: Secondary | ICD-10-CM

## 2023-11-13 DIAGNOSIS — H6123 Impacted cerumen, bilateral: Secondary | ICD-10-CM

## 2023-11-13 DIAGNOSIS — J069 Acute upper respiratory infection, unspecified: Secondary | ICD-10-CM

## 2023-11-13 DIAGNOSIS — H6692 Otitis media, unspecified, left ear: Secondary | ICD-10-CM | POA: Diagnosis not present

## 2023-11-13 MED ORDER — IPRATROPIUM BROMIDE 0.06 % NA SOLN: 2.0000 | Freq: Four times a day (QID) | NASAL | 12 refills | Status: DC

## 2023-11-13 MED ORDER — BENZONATATE 100 MG PO CAPS: 200.0000 mg | ORAL_CAPSULE | Freq: Three times a day (TID) | ORAL | 0 refills | Status: DC

## 2023-11-13 MED ORDER — PROMETHAZINE-DM 6.25-15 MG/5ML PO SYRP: 5.0000 mL | ORAL_SOLUTION | Freq: Four times a day (QID) | ORAL | 0 refills | Status: DC | PRN

## 2023-11-13 MED ORDER — AMOXICILLIN-POT CLAVULANATE 875-125 MG PO TABS: 1.0000 | ORAL_TABLET | Freq: Two times a day (BID) | ORAL | 0 refills | Status: AC

## 2023-11-13 NOTE — ED Provider Notes (Signed)
 [facility]    CSN: [id number] Arrival date & time: 11/13/23  0816      History   Chief Complaint Chief Complaint  Patient presents with   Otalgia   Ear Fullness    HPI Tara Higgins is a 23 y.o. female.   HPI  23 year old female with past medical significant for anxiety, gestational hypertension, and adjustment disorder with depressed mood presents for evaluation of left ear fullness for 1 week followed by development of pain yesterday.  She describes her hearing as being greatly muffled in her left ear.  She has also been experiencing runny nose, nasal congestion, and a nonproductive cough.  She denies any sore throat or drainage from her ear.  She was evaluated in the emergency department at [facility] 8 days ago for the same symptoms and reports that she tested negative for COVID and influenza.  Past Medical History:  Diagnosis Date   Anxiety    Suicidal ideation 10/2019    Patient Active Problem List   Diagnosis Date Noted   Elevated blood pressure affecting pregnancy in third trimester, antepartum 02/12/2022   Preeclampsia, third trimester 02/12/2022   Preeclampsia 02/12/2022   GBS bacteriuria 10/06/2021   Obesity affecting pregnancy in first trimester 08/30/2021   Supervision of high risk pregnancy in third trimester 08/07/2021   Anemia complicating pregnancy in third trimester 04/16/2020   Adjustment disorder with depressed mood 10/10/2019    History reviewed. No pertinent surgical history.  OB History     Gravida  2   Para  1   Term  1   Preterm      AB      Living  1      SAB      IAB      Ectopic      Multiple      Live Births               Home Medications    Prior to Admission medications   Medication Sig Start Date End Date Taking? Authorizing Provider  amoxicillin -clavulanate (AUGMENTIN) 875-125 MG tablet Take 1 tablet by mouth every 12 (twelve) hours for 7 days. 11/13/23 11/20/23 Yes Kent Pear, NP  benzonatate  (TESSALON) 100 MG capsule Take 2 capsules (200 mg total) by mouth every 8 (eight) hours. 11/13/23  Yes Kent Pear, NP  ipratropium (ATROVENT) 0.06 % nasal spray Place 2 sprays into both nostrils 4 (four) times daily. 11/13/23  Yes Kent Pear, NP  promethazine-dextromethorphan (PROMETHAZINE-DM) 6.25-15 MG/5ML syrup Take 5 mLs by mouth 4 (four) times daily as needed. 11/13/23  Yes Kent Pear, NP  acetaminophen  (TYLENOL ) 325 MG tablet Take 2 tablets (650 mg total) by mouth every 4 (four) hours as needed (for pain scale < 4). 02/14/22   Auston Left, CNM  benzocaine -Menthol  (DERMOPLAST) 20-0.5 % AERO Apply 1 Application topically as needed for irritation (perineal discomfort). 02/14/22   Auston Left, CNM  ibuprofen  (ADVIL ) 600 MG tablet Take 1 tablet (600 mg total) by mouth every 6 (six) hours. 02/14/22   Auston Left, CNM  NIFEdipine  (ADALAT  CC) 30 MG 24 hr tablet Take 1 tablet (30 mg total) by mouth daily. 02/14/22   Auston Left, CNM  Prenatal Multivit-Min-Fe-FA (PRENATAL, W/IRON & FA,) 27-0.8 MG TABS Take 1 tablet by mouth daily. Patient not taking: Reported on 02/12/2022    [provider]  witch hazel-glycerin  (TUCKS) pad Apply 1 Application topically as needed for hemorrhoids. 02/14/22   Auston Left,  CNM    Family History Family History  Problem Relation Age of Onset   Depression Mother    Anxiety disorder Mother    Drug abuse Mother    Liver cancer Maternal Grandmother     Social History Social History   Tobacco Use   Smoking status: Never   Smokeless tobacco: Never  Substance Use Topics   Alcohol use: Not Currently   Drug use: Not Currently     Allergies   Patient has no known allergies.   Review of Systems Review of Systems  Constitutional:  Positive for fever.  HENT:  Positive for congestion, ear pain, hearing loss and rhinorrhea. Negative for ear discharge and sore throat.   Respiratory:  Positive for cough. Negative  for shortness of breath and wheezing.      Physical Exam Triage Vital Signs ED Triage Vitals  Encounter Vitals Group     BP      Systolic BP Percentile      Diastolic BP Percentile      Pulse      Resp      Temp      Temp src      SpO2      Weight      Height      Head Circumference      Peak Flow      Pain Score      Pain Loc      Pain Education      Exclude from Growth Chart    No data found.  Updated Vital Signs BP (!) 138/92 (BP Location: Right Arm)   Pulse 79   Temp 97.7 F (36.5 C) (Oral)   Resp 17   SpO2 98%   Visual Acuity Right Eye Distance:   Left Eye Distance:   Bilateral Distance:    Right Eye Near:   Left Eye Near:    Bilateral Near:     Physical Exam Vitals and nursing note reviewed.  Constitutional:      Appearance: Normal appearance. She is not ill-appearing.  HENT:     Head: Normocephalic and atraumatic.     Right Ear: External ear normal. There is impacted cerumen.     Left Ear: External ear normal. There is impacted cerumen.     Nose: Congestion and rhinorrhea present.     Comments: Patient mucosa is edematous and erythematous with clear discharge in both nares.    Mouth/Throat:     Mouth: Mucous membranes are moist.     Pharynx: Oropharynx is clear. No oropharyngeal exudate or posterior oropharyngeal erythema.  Cardiovascular:     Rate and Rhythm: Normal rate and regular rhythm.     Pulses: Normal pulses.     Heart sounds: Normal heart sounds. No murmur heard.    No friction rub. No gallop.  Pulmonary:     Effort: Pulmonary effort is normal.     Breath sounds: Normal breath sounds. No wheezing, rhonchi or rales.  Musculoskeletal:     Cervical back: Normal range of motion and neck supple. No tenderness.  Lymphadenopathy:     Cervical: No cervical adenopathy.  Skin:    General: Skin is warm and dry.     Capillary Refill: Capillary refill takes less than 2 seconds.     Findings: No rash.  Neurological:     General: No focal  deficit present.     Mental Status: She is alert and oriented to person, place, and time.  UC Treatments / Results  Labs (all labs ordered are listed, but only abnormal results are displayed) Labs Reviewed - No data to display  EKG   Radiology No results found.  Procedures Procedures (including critical care time)  Medications Ordered in UC Medications - No data to display  Initial Impression / Assessment and Plan / UC Course  I have reviewed the triage vital signs and the nursing notes.  Pertinent labs & imaging results that were available during my care of the patient were reviewed by me and considered in my medical decision making (see chart for details).   Patient is a nontoxic-appearing 23 year old female presenting for evaluation of left ear fullness and pain as outlined in the HPI above.  She does associate upper respiratory symptoms.  On exam both of her external auditory canals are occluded by cerumen.  I will order a bilateral ear lavage and reassess.  Following ear lavage I reassessed both ears which revealed clear external auditory canals.  Both tympanic membranes were erythematous and injected with a loss of landmarks.  I will discharge patient with a diagnosis of acute otitis media and URI with cough and congestion.  I will start her on Augmentin 875 mg twice daily with food for treatment of both.  Atrovent spray for her congestion.  Tessalon Perles and Promethazine DM cough syrup for cough and congestion.  Return precautions reviewed.  Work note provided.  No LMP in epic.  When the patient was questioned she indicated that her LMP was 2 weeks ago and she is not currently pregnant.   Final Clinical Impressions(s) / UC Diagnoses   Final diagnoses:  Non-recurrent acute suppurative otitis media of both ears without spontaneous rupture of tympanic membranes  Bilateral impacted cerumen  URI with cough and congestion     Discharge Instructions      Take the  Augmentin twice daily for 7 days with food for treatment of your ear infection and URI.  Take an over-the-counter probiotic 1 hour after each dose of antibiotic to prevent diarrhea.  Use over-the-counter Tylenol  and ibuprofen  as needed for pain or fever.  Place a hot water bottle, or heating pad, underneath your pillowcase at night to help dilate up your ear and aid in pain relief as well as resolution of the infection.  Use the Atrovent nasal spray, 2 squirts in each nostril every 6 hours, as needed for runny nose and postnasal drip.  Use the Tessalon Perles every 8 hours during the day.  Take them with a small sip of water.  They may give you some numbness to the base of your tongue or a metallic taste in your mouth, this is normal.  Use the Promethazine DM cough syrup at bedtime for cough and congestion.  It will make you drowsy so do not take it during the day.  Return for reevaluation or see your primary care provider for any new or worsening symptoms.    ED Prescriptions     Medication Sig Dispense Auth. Provider   amoxicillin -clavulanate (AUGMENTIN) 875-125 MG tablet Take 1 tablet by mouth every 12 (twelve) hours for 7 days. 14 tablet Kent Pear, NP   benzonatate (TESSALON) 100 MG capsule Take 2 capsules (200 mg total) by mouth every 8 (eight) hours. 21 capsule Kent Pear, NP   ipratropium (ATROVENT) 0.06 % nasal spray Place 2 sprays into both nostrils 4 (four) times daily. 15 mL Kent Pear, NP   promethazine-dextromethorphan (PROMETHAZINE-DM) 6.25-15 MG/5ML syrup Take 5 mLs by mouth  4 (four) times daily as needed. 118 mL Kent Pear, NP      PDMP not reviewed this encounter.   Kent Pear, NP 11/13/23 [phone]

## 2023-11-13 NOTE — ED Triage Notes (Signed)
 Left ear fulness for 1 week and started hurting last night

## 2023-11-13 NOTE — Discharge Instructions (Addendum)
 Take the Augmentin  twice daily for 7 days with food for treatment of your ear infection and URI.  Take an over-the-counter probiotic 1 hour after each dose of antibiotic to prevent diarrhea.  Use over-the-counter Tylenol and ibuprofen as needed for pain or fever.  Place a hot water bottle, or heating pad, underneath your pillowcase at night to help dilate up your ear and aid in pain relief as well as resolution of the infection.  Use the Atrovent  nasal spray, 2 squirts in each nostril every 6 hours, as needed for runny nose and postnasal drip.  Use the Tessalon  Perles every 8 hours during the day.  Take them with a small sip of water.  They may give you some numbness to the base of your tongue or a metallic taste in your mouth, this is normal.  Use the Promethazine  DM cough syrup at bedtime for cough and congestion.  It will make you drowsy so do not take it during the day.  Return for reevaluation or see your primary care provider for any new or worsening symptoms.

## 2023-12-14 DIAGNOSIS — R509 Fever, unspecified: Secondary | ICD-10-CM | POA: Diagnosis not present

## 2023-12-14 DIAGNOSIS — J020 Streptococcal pharyngitis: Secondary | ICD-10-CM | POA: Diagnosis not present

## 2023-12-14 DIAGNOSIS — B348 Other viral infections of unspecified site: Secondary | ICD-10-CM | POA: Diagnosis not present

## 2023-12-16 DIAGNOSIS — J020 Streptococcal pharyngitis: Secondary | ICD-10-CM | POA: Diagnosis not present

## 2024-01-23 ENCOUNTER — Other Ambulatory Visit

## 2024-01-25 ENCOUNTER — Other Ambulatory Visit

## 2024-02-04 ENCOUNTER — Encounter: Payer: Self-pay | Admitting: Emergency Medicine

## 2024-02-04 ENCOUNTER — Ambulatory Visit
Admission: EM | Admit: 2024-02-04 | Discharge: 2024-02-04 | Disposition: A | Discharge status: Home / Self Care | Attending: Emergency Medicine | Admitting: Emergency Medicine

## 2024-02-04 DIAGNOSIS — N390 Urinary tract infection, site not specified: Secondary | ICD-10-CM | POA: Insufficient documentation

## 2024-02-04 LAB — URINALYSIS, W/ REFLEX TO CULTURE (INFECTION SUSPECTED)
Bilirubin Urine: NEGATIVE
Glucose, UA: NEGATIVE mg/dL
Ketones, ur: NEGATIVE mg/dL
Nitrite: NEGATIVE
Protein, ur: 30 mg/dL — AB
Specific Gravity, Urine: 1.02 (ref 1.005–1.030)
WBC, UA: >50 WBC/hpf (ref 0–5)
pH: 6.5 (ref 5.0–8.0)

## 2024-02-04 MED ORDER — CEFDINIR 300 MG PO CAPS: 300.0000 mg | ORAL_CAPSULE | Freq: Two times a day (BID) | ORAL | 0 refills | Status: AC

## 2024-02-04 MED ORDER — PHENAZOPYRIDINE HCL 200 MG PO TABS: 200.0000 mg | ORAL_TABLET | Freq: Three times a day (TID) | ORAL | 0 refills | Status: DC

## 2024-02-04 NOTE — Discharge Instructions (Addendum)
 Take the cefdinir twice daily for 7 days with food for treatment of urinary tract infection.  Use the Pyridium every 8 hours as needed for urinary discomfort.  This will turn your urine a bright red-orange.  Increase your oral fluid intake so that you increase your urine production and or flushing your urinary system.  Take an over-the-counter probiotic, such as Culturelle-Align-Activia, 1 hour after each dose of antibiotic to prevent diarrhea or yeast infections from forming.  We will culture urine and change the antibiotics if necessary.  Return for reevaluation, or see your primary care provider, for any new or worsening symptoms.

## 2024-02-04 NOTE — ED Provider Notes (Signed)
 [facility]    CSN: [id number] Arrival date & time: 02/04/24  1428      History   Chief Complaint Chief Complaint  Patient presents with   Dysuria    HPI Tara Higgins is a 23 y.o. female.   HPI  23 year old female with past medical history significant for her anxiety, adjustment disorder with depressed mood, and group beta strep bacteriuria presents for evaluation of painful urination with urinary urgency and frequency that started yesterday.  She also reports that she has seen a little blood in her urine today.  She had low back pain yesterday but reports that that has resolved today.  No fever, abdominal pain, nausea, or vomiting.  Past Medical History:  Diagnosis Date   Anxiety    Suicidal ideation 10/2019    Patient Active Problem List   Diagnosis Date Noted   Elevated blood pressure affecting pregnancy in third trimester, antepartum 02/12/2022   Preeclampsia, third trimester 02/12/2022   Preeclampsia 02/12/2022   GBS bacteriuria 10/06/2021   Obesity affecting pregnancy in first trimester 08/30/2021   Supervision of high risk pregnancy in third trimester 08/07/2021   Anemia complicating pregnancy in third trimester 04/16/2020   Adjustment disorder with depressed mood 10/10/2019    History reviewed. No pertinent surgical history.  OB History     Gravida  2   Para  1   Term  1   Preterm      AB      Living  1      SAB      IAB      Ectopic      Multiple      Live Births               Home Medications    Prior to Admission medications   Medication Sig Start Date End Date Taking? Authorizing Provider  cefdinir  (OMNICEF ) 300 MG capsule Take 1 capsule (300 mg total) by mouth 2 (two) times daily for 7 days. 02/04/24 02/11/24 Yes Bernardino Ditch, NP  phenazopyridine  (PYRIDIUM ) 200 MG tablet Take 1 tablet (200 mg total) by mouth 3 (three) times daily. 02/04/24  Yes Bernardino Ditch, NP  acetaminophen  (TYLENOL ) 325 MG tablet Take 2 tablets  (650 mg total) by mouth every 4 (four) hours as needed (for pain scale < 4). 02/14/22   Tanda Edsel Fuller, CNM  ibuprofen  (ADVIL ) 600 MG tablet Take 1 tablet (600 mg total) by mouth every 6 (six) hours. 02/14/22   Tanda Edsel Fuller, CNM  Prenatal Multivit-Min-Fe-FA (PRENATAL, W/IRON & FA,) 27-0.8 MG TABS Take 1 tablet by mouth daily. Patient not taking: Reported on 02/12/2022    [provider]    Family History Family History  Problem Relation Age of Onset   Depression Mother    Anxiety disorder Mother    Drug abuse Mother    Liver cancer Maternal Grandmother     Social History Social History   Tobacco Use   Smoking status: Never   Smokeless tobacco: Never  Vaping Use   Vaping status: Never Used  Substance Use Topics   Alcohol use: Not Currently   Drug use: Not Currently     Allergies   Patient has no known allergies.   Review of Systems Review of Systems  Constitutional:  Negative for fever.  Gastrointestinal:  Negative for abdominal pain, nausea and vomiting.  Genitourinary:  Positive for dysuria, frequency, hematuria and urgency. Negative for vaginal discharge and vaginal pain.  Musculoskeletal:  Positive  for back pain.     Physical Exam Triage Vital Signs ED Triage Vitals  Encounter Vitals Group     BP      Girls Systolic BP Percentile      Girls Diastolic BP Percentile      Boys Systolic BP Percentile      Boys Diastolic BP Percentile      Pulse      Resp      Temp      Temp src      SpO2      Weight      Height      Head Circumference      Peak Flow      Pain Score      Pain Loc      Pain Education      Exclude from Growth Chart    No data found.  Updated Vital Signs BP (!) 135/93 (BP Location: Left Arm)   Pulse 84   Temp 98.1 F (36.7 C) (Oral)   Resp 14   Ht 5' 2 (1.575 m)   Wt 210 lb 1.6 oz (95.3 kg)   LMP 01/21/2024   SpO2 100%   Breastfeeding No   BMI 38.43 kg/m   Visual Acuity Right Eye Distance:   Left Eye  Distance:   Bilateral Distance:    Right Eye Near:   Left Eye Near:    Bilateral Near:     Physical Exam Vitals and nursing note reviewed.  Constitutional:      Appearance: Normal appearance. She is not ill-appearing.  HENT:     Head: Normocephalic and atraumatic.  Cardiovascular:     Rate and Rhythm: Normal rate and regular rhythm.     Pulses: Normal pulses.     Heart sounds: Normal heart sounds. No murmur heard.    No friction rub. No gallop.  Pulmonary:     Effort: Pulmonary effort is normal.     Breath sounds: Normal breath sounds. No wheezing, rhonchi or rales.  Abdominal:     Tenderness: There is no right CVA tenderness or left CVA tenderness.  Skin:    General: Skin is warm and dry.     Capillary Refill: Capillary refill takes less than 2 seconds.     Findings: No rash.  Neurological:     General: No focal deficit present.     Mental Status: She is alert and oriented to person, place, and time.      UC Treatments / Results  Labs (all labs ordered are listed, but only abnormal results are displayed) Labs Reviewed  URINALYSIS, W/ REFLEX TO CULTURE (INFECTION SUSPECTED) - Abnormal; Notable for the following components:      Result Value   Color, Urine STRAW (*)    APPearance HAZY (*)    Hgb urine dipstick MODERATE (*)    Protein, ur 30 (*)    Leukocytes,Ua SMALL (*)    Bacteria, UA FEW (*)    All other components within normal limits  URINE CULTURE    EKG   Radiology No results found.  Procedures Procedures (including critical care time)  Medications Ordered in UC Medications - No data to display  Initial Impression / Assessment and Plan / UC Course  I have reviewed the triage vital signs and the nursing notes.  Pertinent labs & imaging results that were available during my care of the patient were reviewed by me and considered in my medical decision making (see chart for details).  Patient is a pleasant, nontoxic-appearing 23 year old female  presents for evaluation of UTI symptoms that began yesterday as outlined in HPI above.  In the exam room she not in any acute distress.  She does have a history of UTIs and has had a history of group B strep bacteriuria.  I will order urinalysis to assess for the presence of UTI.  Urinalysis shows moderate hemoglobin with 30 protein and small leukocyte esterase, negative for nitrates or glucose.  Reflex microscopy shows greater than 50 WBCs, 11-20 RBCs, few bacteria, and WBC clumps.  Urine will reflex to culture.  I will discharge patient with a diagnosis of UTI and start her on cefdinir  300 mg twice daily for 7 days along with Pyridium  every 8 hours as needed for urinary discomfort.   Final Clinical Impressions(s) / UC Diagnoses   Final diagnoses:  Lower urinary tract infectious disease     Discharge Instructions      Take the cefdinir  twice daily for 7 days with food for treatment of urinary tract infection.  Use the Pyridium  every 8 hours as needed for urinary discomfort.  This will turn your urine a bright red-orange.  Increase your oral fluid intake so that you increase your urine production and or flushing your urinary system.  Take an over-the-counter probiotic, such as Culturelle-Align-Activia, 1 hour after each dose of antibiotic to prevent diarrhea or yeast infections from forming.  We will culture urine and change the antibiotics if necessary.  Return for reevaluation, or see your primary care provider, for any new or worsening symptoms.      ED Prescriptions     Medication Sig Dispense Auth. Provider   cefdinir  (OMNICEF ) 300 MG capsule Take 1 capsule (300 mg total) by mouth 2 (two) times daily for 7 days. 14 capsule Bernardino Ditch, NP   phenazopyridine  (PYRIDIUM ) 200 MG tablet Take 1 tablet (200 mg total) by mouth 3 (three) times daily. 6 tablet Bernardino Ditch, NP      PDMP not reviewed this encounter.   Bernardino Ditch, NP 02/04/24 [phone]

## 2024-02-04 NOTE — ED Triage Notes (Signed)
 Patient c/o dysuria and urinary frequency that started yesterday.  Patient denies vaginal discharge.  Patient denies fevers.

## 2024-02-05 LAB — URINE CULTURE

## 2024-02-09 ENCOUNTER — Telehealth: Payer: Self-pay | Admitting: Emergency Medicine

## 2024-02-10 ENCOUNTER — Ambulatory Visit (HOSPITAL_COMMUNITY): Payer: Self-pay

## 2024-02-11 ENCOUNTER — Ambulatory Visit
Admission: EM | Admit: 2024-02-11 | Discharge: 2024-02-11 | Disposition: A | Discharge status: Home / Self Care | Attending: Physician Assistant | Admitting: Physician Assistant

## 2024-02-11 DIAGNOSIS — R300 Dysuria: Secondary | ICD-10-CM | POA: Diagnosis not present

## 2024-02-11 DIAGNOSIS — N3000 Acute cystitis without hematuria: Secondary | ICD-10-CM | POA: Insufficient documentation

## 2024-02-11 LAB — URINALYSIS, W/ REFLEX TO CULTURE (INFECTION SUSPECTED)
Bilirubin Urine: NEGATIVE
Glucose, UA: NEGATIVE mg/dL
Ketones, ur: NEGATIVE mg/dL
Nitrite: NEGATIVE
Protein, ur: 100 mg/dL — AB
Specific Gravity, Urine: 1.02 (ref 1.005–1.030)
pH: 7 (ref 5.0–8.0)

## 2024-02-11 MED ORDER — PHENAZOPYRIDINE HCL 200 MG PO TABS
200.0000 mg | ORAL_TABLET | Freq: Three times a day (TID) | ORAL | 0 refills | Status: AC
Start: 2024-02-11 — End: ?

## 2024-02-11 MED ORDER — NITROFURANTOIN MONOHYD MACRO 100 MG PO CAPS: 100.0000 mg | ORAL_CAPSULE | Freq: Two times a day (BID) | ORAL | 0 refills | Status: AC

## 2024-02-11 NOTE — ED Triage Notes (Signed)
 Pt c/o possible UTI  Pt was seen on 02/04/24 for similar symptoms and was given antibiotics  Pt states that the antibiotics are not working.

## 2024-02-11 NOTE — Discharge Instructions (Signed)

## 2024-02-11 NOTE — ED Provider Notes (Signed)
 [facility]    CSN: [id number] Arrival date & time: 02/11/24  1333      History   Chief Complaint Chief Complaint  Patient presents with   Urinary Tract Infection    HPI Courtnei Ruddell is a 23 y.o. female presenting for dysuria, frequency and urgency for over a week.  Patient was seen here 1 week ago for urinary symptoms and treated with cefdinir  for suspected UTI. Urine culture with multiple species, suggested recollection.   Patient took the medication but says she took it maybe once a day and then would skip a couple days and restarted.  She thinks she took about 4 days total but says it did not seem to help.  Denies fever, abdominal pain, flank pain, hematuria, vaginal discharge or odor.  LMP was 2 weeks ago.  HPI  Past Medical History:  Diagnosis Date   Anxiety    Suicidal ideation 10/2019    Patient Active Problem List   Diagnosis Date Noted   Elevated blood pressure affecting pregnancy in third trimester, antepartum 02/12/2022   Preeclampsia, third trimester 02/12/2022   Preeclampsia 02/12/2022   GBS bacteriuria 10/06/2021   Obesity affecting pregnancy in first trimester 08/30/2021   Supervision of high risk pregnancy in third trimester 08/07/2021   Anemia complicating pregnancy in third trimester 04/16/2020   Adjustment disorder with depressed mood 10/10/2019    History reviewed. No pertinent surgical history.  OB History     Gravida  2   Para  1   Term  1   Preterm      AB      Living  1      SAB      IAB      Ectopic      Multiple      Live Births               Home Medications    Prior to Admission medications   Medication Sig Start Date End Date Taking? Authorizing Provider  acetaminophen  (TYLENOL ) 325 MG tablet Take 2 tablets (650 mg total) by mouth every 4 (four) hours as needed (for pain scale < 4). 02/14/22  Yes Wilson, Edsel Fuller, CNM  cefdinir  (OMNICEF ) 300 MG capsule Take 1 capsule (300 mg total) by  mouth 2 (two) times daily for 7 days. 02/04/24 02/11/24 Yes Bernardino Ditch, NP  ibuprofen  (ADVIL ) 600 MG tablet Take 1 tablet (600 mg total) by mouth every 6 (six) hours. 02/14/22  Yes Wilson, Danielle Renee, CNM  nitrofurantoin , macrocrystal-monohydrate, (MACROBID ) 100 MG capsule Take 1 capsule (100 mg total) by mouth 2 (two) times daily. 02/11/24  Yes Arvis Jolan NOVAK, PA-C  phenazopyridine  (PYRIDIUM ) 200 MG tablet Take 1 tablet (200 mg total) by mouth 3 (three) times daily. 02/11/24  Yes Arvis Jolan NOVAK, PA-C  Prenatal Multivit-Min-Fe-FA (PRENATAL, W/IRON & FA,) 27-0.8 MG TABS Take 1 tablet by mouth daily. Patient not taking: Reported on 02/12/2022    [provider]    Family History Family History  Problem Relation Age of Onset   Depression Mother    Anxiety disorder Mother    Drug abuse Mother    Liver cancer Maternal Grandmother     Social History Social History   Tobacco Use   Smoking status: Never   Smokeless tobacco: Never  Vaping Use   Vaping status: Never Used  Substance Use Topics   Alcohol use: Not Currently   Drug use: Not Currently     Allergies  Patient has no known allergies.   Review of Systems Review of Systems  Constitutional:  Negative for chills, fatigue and fever.  Gastrointestinal:  Negative for abdominal pain, diarrhea, nausea and vomiting.  Genitourinary:  Positive for dysuria, frequency and urgency. Negative for decreased urine volume, flank pain, hematuria, pelvic pain, vaginal bleeding, vaginal discharge and vaginal pain.  Musculoskeletal:  Negative for back pain.  Skin:  Negative for rash.     Physical Exam Triage Vital Signs ED Triage Vitals  Encounter Vitals Group     BP      Girls Systolic BP Percentile      Girls Diastolic BP Percentile      Boys Systolic BP Percentile      Boys Diastolic BP Percentile      Pulse      Resp      Temp      Temp src      SpO2      Weight      Height      Head Circumference      Peak Flow       Pain Score      Pain Loc      Pain Education      Exclude from Growth Chart    No data found.  Updated Vital Signs BP 122/88 (BP Location: Left Arm)   Pulse 77   Temp 97.8 F (36.6 C) (Oral)   LMP 01/21/2024   SpO2 100%    Physical Exam Vitals and nursing note reviewed.  Constitutional:      General: She is not in acute distress.    Appearance: Normal appearance. She is not ill-appearing or toxic-appearing.  HENT:     Head: Normocephalic and atraumatic.  Eyes:     General: No scleral icterus.       Right eye: No discharge.        Left eye: No discharge.     Conjunctiva/sclera: Conjunctivae normal.  Cardiovascular:     Rate and Rhythm: Normal rate and regular rhythm.     Heart sounds: Normal heart sounds.  Pulmonary:     Effort: Pulmonary effort is normal. No respiratory distress.     Breath sounds: Normal breath sounds.  Abdominal:     Palpations: Abdomen is soft.     Tenderness: There is no abdominal tenderness. There is no right CVA tenderness or left CVA tenderness.  Musculoskeletal:     Cervical back: Neck supple.  Skin:    General: Skin is dry.  Neurological:     General: No focal deficit present.     Mental Status: She is alert. Mental status is at baseline.     Motor: No weakness.     Gait: Gait normal.  Psychiatric:        Mood and Affect: Mood normal.        Behavior: Behavior normal.      UC Treatments / Results  Labs (all labs ordered are listed, but only abnormal results are displayed) Labs Reviewed  URINALYSIS, W/ REFLEX TO CULTURE (INFECTION SUSPECTED) - Abnormal; Notable for the following components:      Result Value   APPearance HAZY (*)    Hgb urine dipstick SMALL (*)    Protein, ur 100 (*)    Leukocytes,Ua MODERATE (*)    Bacteria, UA MANY (*)    All other components within normal limits  URINE CULTURE    EKG   Radiology No results found.  Procedures Procedures (including  critical care time)  Medications Ordered in  UC Medications - No data to display  Initial Impression / Assessment and Plan / UC Course  I have reviewed the triage vital signs and the nursing notes.  Pertinent labs & imaging results that were available during my care of the patient were reviewed by me and considered in my medical decision making (see chart for details).   23 year old female presents for over a week history of dysuria, frequency and urgency.  Seen here a week ago.  Urine culture showed multiple species and suggested recollection.  She took cefdinir  for about 4 days but not consecutively.  She says it did not really seem to help anyway.  Urinalysis today shows hazy urine with small hemoglobin, protein, moderate leukocytes, many bacteria and clumps of white blood cells.  Urine sent for culture.  This consistent with urinary tract infection.  Sent Macrobid  and Pyridium .  Will amend treatment based on culture result if necessary.  Encouraged her to take the medication as directed and not skip any doses.  Reviewed return precautions.   Final Clinical Impressions(s) / UC Diagnoses   Final diagnoses:  Acute cystitis without hematuria  Dysuria     Discharge Instructions      UTI: Based on either symptoms or urinalysis, you may have a urinary tract infection. We will send the urine for culture and call with results in a few days. Begin antibiotics at this time. Your symptoms should be much improved over the next 2-3 days. Increase rest and fluid intake. If for some reason symptoms are worsening or not improving after a couple of days or the urine culture determines the antibiotics you are taking will not treat the infection, the antibiotics may be changed. Return or go to ER for fever, back pain, worsening urinary pain, discharge, increased blood in urine. May take Tylenol  or Motrin  OTC for pain relief or consider AZO if no contraindications      ED Prescriptions     Medication Sig Dispense Auth. Provider    nitrofurantoin , macrocrystal-monohydrate, (MACROBID ) 100 MG capsule Take 1 capsule (100 mg total) by mouth 2 (two) times daily. 10 capsule Arvis Huxley B, PA-C   phenazopyridine  (PYRIDIUM ) 200 MG tablet Take 1 tablet (200 mg total) by mouth 3 (three) times daily. 6 tablet Alexis Reber B, PA-C      PDMP not reviewed this encounter.   Arvis Huxley NOVAK, PA-C 02/11/24 1440

## 2024-02-13 LAB — URINE CULTURE: Culture: 80000 — AB

## 2024-02-14 ENCOUNTER — Ambulatory Visit (HOSPITAL_COMMUNITY): Payer: Self-pay

## 2024-03-25 ENCOUNTER — Emergency Department

## 2024-03-25 ENCOUNTER — Emergency Department
Admission: EM | Admit: 2024-03-25 | Discharge: 2024-03-25 | Disposition: A | Discharge status: Home / Self Care | Attending: Emergency Medicine | Admitting: Emergency Medicine

## 2024-03-25 ENCOUNTER — Other Ambulatory Visit: Payer: Self-pay

## 2024-03-25 ENCOUNTER — Encounter: Payer: Self-pay | Admitting: Emergency Medicine

## 2024-03-25 DIAGNOSIS — O209 Hemorrhage in early pregnancy, unspecified: Secondary | ICD-10-CM | POA: Diagnosis not present

## 2024-03-25 DIAGNOSIS — O208 Other hemorrhage in early pregnancy: Secondary | ICD-10-CM

## 2024-03-25 DIAGNOSIS — Z3A01 Less than 8 weeks gestation of pregnancy: Secondary | ICD-10-CM | POA: Diagnosis not present

## 2024-03-25 DIAGNOSIS — Z3A00 Weeks of gestation of pregnancy not specified: Secondary | ICD-10-CM | POA: Diagnosis not present

## 2024-03-25 DIAGNOSIS — O4690 Antepartum hemorrhage, unspecified, unspecified trimester: Secondary | ICD-10-CM

## 2024-03-25 LAB — URINALYSIS, ROUTINE W REFLEX MICROSCOPIC
Bilirubin Urine: NEGATIVE
Glucose, UA: NEGATIVE mg/dL
Hgb urine dipstick: NEGATIVE
Ketones, ur: NEGATIVE mg/dL
Leukocytes,Ua: NEGATIVE
Nitrite: NEGATIVE
Protein, ur: NEGATIVE mg/dL
Specific Gravity, Urine: 1.002 — ABNORMAL LOW (ref 1.005–1.030)
pH: 7 (ref 5.0–8.0)

## 2024-03-25 LAB — CBC
HCT: 42.3 % (ref 36.0–46.0)
Hemoglobin: 14.1 g/dL (ref 12.0–15.0)
MCH: 29.1 pg (ref 26.0–34.0)
MCHC: 33.3 g/dL (ref 30.0–36.0)
MCV: 87.4 fL (ref 80.0–100.0)
Platelets: 250 K/uL (ref 150–400)
RBC: 4.84 MIL/uL (ref 3.87–5.11)
RDW: 12 % (ref 11.5–15.5)
WBC: 8.9 K/uL (ref 4.0–10.5)
nRBC: 0 % (ref 0.0–0.2)

## 2024-03-25 LAB — ABO/RH: ABO/RH(D): O POS

## 2024-03-25 LAB — HCG, QUANTITATIVE, PREGNANCY: hCG, Beta Chain, Quant, S: 13528 m[IU]/mL — ABNORMAL HIGH (ref <5)

## 2024-03-25 NOTE — ED Notes (Signed)
 Pt noted some bleeding earlier today x1 episodes with no cramping or pain. Was concerned due to her being pregnant. Not having any on going bleeding at this time.

## 2024-03-25 NOTE — Discharge Instructions (Signed)
 You were evaluated in the ED for vaginal bleeding during early pregnancy.  Your lab work is reassuring.  Your ultrasound shows that you are [redacted] weeks pregnant with a small subchorionic hemorrhage.  This is common in pregnancy.  Recommendation for a repeat beta hCG and ultrasound in 14 days.  Please follow-up with your OB/GYN.  If you cannot set an appointment for this you are free to return to ED.  Return to ED for new or worsening symptoms.

## 2024-03-25 NOTE — ED Provider Notes (Signed)
 [facility] Emergency Department Provider Note     Event Date/Time   First MD Initiated Contact with Patient 03/25/24 1909     (approximate)   History   Vaginal Bleeding   HPI  Tara Higgins is a 23 y.o. female with a past medical history of anxiety presents to the ED for evaluation of vaginal bleeding during early pregnancy.  Patient reports she has had 3 positive at home pregnancy test over the past 3 weeks.  This has not been verified.  She reports today waking up with vaginal bleeding with small clots.  This has completely resolved during my evaluation.  She denies pain, fever, dysuria, nausea or vomiting.  She has not had her first prenatal appointment yet.     Physical Exam   Triage Vital Signs: ED Triage Vitals [03/25/24 1853]  Encounter Vitals Group     BP (!) 142/97     Girls Systolic BP Percentile      Girls Diastolic BP Percentile      Boys Systolic BP Percentile      Boys Diastolic BP Percentile      Pulse Rate 96     Resp 15     Temp 98.8 F (37.1 C)     Temp Source Oral     SpO2 100 %     Weight 240 lb (108.9 kg)     Height 5' 3 (1.6 m)     Head Circumference      Peak Flow      Pain Score 0     Pain Loc      Pain Education      Exclude from Growth Chart     Most recent vital signs: Vitals:   03/25/24 1853  BP: (!) 142/97  Pulse: 96  Resp: 15  Temp: 98.8 F (37.1 C)  SpO2: 100%   General Awake, no distress.  HEENT NCAT.  CV:  Good peripheral perfusion.  RESP:  Normal effort.  ABD:  No distention. Soft, nontender  ED Results / Procedures / Treatments   Labs (all labs ordered are listed, but only abnormal results are displayed) Labs Reviewed  HCG, QUANTITATIVE, PREGNANCY - Abnormal; Notable for the following components:      Result Value   hCG, Beta Chain, Quant, S 13,528 (*)    All other components within normal limits  URINALYSIS, ROUTINE W REFLEX MICROSCOPIC - Abnormal; Notable for the following  components:   Color, Urine COLORLESS (*)    APPearance CLEAR (*)    Specific Gravity, Urine 1.002 (*)    All other components within normal limits  CBC  ABO/RH   RADIOLOGY  I personally viewed and evaluated these images as part of my medical decision making, as well as reviewing the written report by the radiologist.  ED Provider Interpretation: Small subchorionic hemorrhage  US  OB LESS THAN 14 WEEKS WITH OB TRANSVAGINAL Result Date: 03/25/2024 CLINICAL DATA:  Vaginal bleeding with positive pregnancy test EXAM: OBSTETRIC <14 WK US  AND TRANSVAGINAL OB US  TECHNIQUE: Both transabdominal and transvaginal ultrasound examinations were performed for complete evaluation of the gestation as well as the maternal uterus, adnexal regions, and pelvic cul-de-sac. Transvaginal technique was performed to assess early pregnancy. COMPARISON:  None Available. FINDINGS: Intrauterine gestational sac: Present Yolk sac:  Present Embryo:  Absent MSD: 9.8 mm   5 w   5 d Subchorionic hemorrhage:  Small subchorionic hemorrhage is noted. Maternal uterus/adnexae: Ovaries are within normal limits. IMPRESSION: Intrauterine gestational  sac is seen with yolk sac although no fetal pole is noted. Recommend follow-up quantitative B-HCG levels and follow-up US  in 14 days to assess viability. This recommendation follows SRU consensus guidelines: Diagnostic Criteria for Nonviable Pregnancy Early in the First Trimester. LOISE Diedra PARAS Med 2013; [id number]. Small subchorionic hemorrhage. Electronically Signed   By: Oneil Devonshire M.D.   On: 03/25/2024 20:33    PROCEDURES:  Critical Care performed: No  Procedures   MEDICATIONS ORDERED IN ED: Medications - No data to display   IMPRESSION / MDM / ASSESSMENT AND PLAN / ED COURSE  I reviewed the triage vital signs and the nursing notes.                              Clinical Course as of 03/25/24 2051  [place] Mar 25, 2024  2007 CBC Stable hemoglobin [MH]  2008 ABO/Rh O+ [MH]  2008  hCG, quantitative, pregnancy(!) 13,528 [MH]  2050 US  OB LESS THAN 14 WEEKS WITH OB TRANSVAGINAL ntrauterine gestational sac is seen with yolk sac although no fetal pole is noted. Recommend follow-up quantitative B-HCG levels and follow-up US  in 14 days to assess viability.   Small Subchorionic hemorrhage [MH]    Clinical Course User Index [MH] Margrette, Natividad Schlosser A, PA-C    23 y.o. female presents to the emergency department for evaluation and treatment of vaginal bleeding during early pregnancy. See HPI for further details.   Differential diagnosis includes, but is not limited to, threatened miscarriage, incomplete miscarriage, normal bleeding from an early trimester pregnancy, ectopic pregnancy, , blighted ovum, vaginal/cervical trauma, subchorionic hemorrhage/hematoma, etc.   Patient's presentation is most consistent with acute complicated illness / injury requiring diagnostic workup.  Patient is alert and oriented.  She is hemodynamic stable.  Physical exam findings are stated above.  Lab work is reassuring.  Urinalysis shows no infectious etiology.  Ultrasound reveals intrauterine gestational sac with yolk sac however no fetal pole.  Small subchorionic hemorrhage noted.  Patient advised to follow-up with her OB/GYN for follow-up beta-hCG and ultrasound in 14 days.  She verbalized understanding.  Patient stable condition for discharge home.  ED return precaution discussed.   FINAL CLINICAL IMPRESSION(S) / ED DIAGNOSES   Final diagnoses:  Vaginal bleeding in pregnancy   Rx / DC Orders   ED Discharge Orders     None        Note:  This document was prepared using Dragon voice recognition software and may include unintentional dictation errors.    Margrette, Rosemaria Inabinet A, PA-C 03/25/24 2056    Viviann Pastor, MD 03/25/24 2244

## 2024-03-25 NOTE — ED Triage Notes (Signed)
 Pt in via POV, patient states she is approximately [redacted] weeks pregnant, woke up this morning w/ vaginal bleeding, noticing small clots as well.  States bleeding has since subsided, denies any pain.  Initial OB visit scheduled for 04/06/2024.  Ambulatory to triage, NAD noted at this time.

## 2024-04-06 DIAGNOSIS — N912 Amenorrhea, unspecified: Secondary | ICD-10-CM | POA: Diagnosis not present

## 2024-04-12 DIAGNOSIS — O99211 Obesity complicating pregnancy, first trimester: Secondary | ICD-10-CM | POA: Diagnosis not present

## 2024-04-12 DIAGNOSIS — O09891 Supervision of other high risk pregnancies, first trimester: Secondary | ICD-10-CM | POA: Diagnosis not present

## 2024-04-12 DIAGNOSIS — O09291 Supervision of pregnancy with other poor reproductive or obstetric history, first trimester: Secondary | ICD-10-CM | POA: Diagnosis not present

## 2024-04-12 DIAGNOSIS — O0991 Supervision of high risk pregnancy, unspecified, first trimester: Secondary | ICD-10-CM | POA: Diagnosis not present

## 2024-05-31 ENCOUNTER — Other Ambulatory Visit: Payer: Self-pay

## 2024-05-31 DIAGNOSIS — O99891 Other specified diseases and conditions complicating pregnancy: Secondary | ICD-10-CM | POA: Diagnosis not present

## 2024-05-31 DIAGNOSIS — Z5321 Procedure and treatment not carried out due to patient leaving prior to being seen by health care provider: Secondary | ICD-10-CM | POA: Insufficient documentation

## 2024-05-31 DIAGNOSIS — Z3A15 15 weeks gestation of pregnancy: Secondary | ICD-10-CM | POA: Diagnosis not present

## 2024-05-31 DIAGNOSIS — H9202 Otalgia, left ear: Secondary | ICD-10-CM | POA: Insufficient documentation

## 2024-05-31 DIAGNOSIS — O26892 Other specified pregnancy related conditions, second trimester: Secondary | ICD-10-CM | POA: Diagnosis present

## 2024-05-31 NOTE — ED Triage Notes (Addendum)
 Pt presents for left ear pain and decreased hearing. Denies fevers, or recent illness. Pt is [redacted] weeks pregnant

## 2024-06-01 ENCOUNTER — Emergency Department
Admission: EM | Admit: 2024-06-01 | Discharge: 2024-06-01 | Attending: Emergency Medicine | Admitting: Emergency Medicine
